# Patient Record
Sex: Male | Born: 1960
Health system: Southern US, Community
[De-identification: ages and names within clinical notes are randomized; demographics above are authoritative.]

## PROBLEM LIST (undated history)

## (undated) DIAGNOSIS — N189 Chronic kidney disease, unspecified: Secondary | ICD-10-CM

## (undated) DIAGNOSIS — Z87442 Personal history of urinary calculi: Secondary | ICD-10-CM

## (undated) HISTORY — PX: APPENDECTOMY: SHX54

## (undated) HISTORY — PX: VASECTOMY: SHX75

## (undated) HISTORY — PX: FRACTURE SURGERY: SHX138

## (undated) HISTORY — PX: TONSILLECTOMY: SUR1361

## (undated) HISTORY — PX: BACK SURGERY: SHX140

---

## 2005-07-30 ENCOUNTER — Ambulatory Visit: Payer: Self-pay | Admitting: Orthopaedic Surgery

## 2005-08-22 ENCOUNTER — Ambulatory Visit: Payer: Self-pay | Admitting: Orthopaedic Surgery

## 2005-11-18 ENCOUNTER — Observation Stay (HOSPITAL_COMMUNITY): Admission: RE | Admit: 2005-11-18 | Discharge: 2005-11-19 | Payer: Self-pay | Admitting: Neurosurgery

## 2007-02-20 ENCOUNTER — Ambulatory Visit: Payer: Self-pay | Admitting: Internal Medicine

## 2015-05-02 ENCOUNTER — Encounter: Payer: Self-pay | Admitting: *Deleted

## 2015-05-03 ENCOUNTER — Ambulatory Visit: Payer: 59 | Admitting: Certified Registered"

## 2015-05-03 ENCOUNTER — Encounter: Payer: Self-pay | Admitting: *Deleted

## 2015-05-03 ENCOUNTER — Encounter
Admission: RE | Disposition: A | Discharge status: Home / Self Care | Payer: Self-pay | Source: Ambulatory Visit | Attending: Unknown Physician Specialty

## 2015-05-03 ENCOUNTER — Ambulatory Visit
Admission: RE | Admit: 2015-05-03 | Discharge: 2015-05-03 | Disposition: A | Discharge status: Home / Self Care | Payer: 59 | Source: Ambulatory Visit | Attending: Unknown Physician Specialty | Admitting: Unknown Physician Specialty

## 2015-05-03 DIAGNOSIS — Z7982 Long term (current) use of aspirin: Secondary | ICD-10-CM | POA: Insufficient documentation

## 2015-05-03 DIAGNOSIS — K640 First degree hemorrhoids: Secondary | ICD-10-CM | POA: Insufficient documentation

## 2015-05-03 DIAGNOSIS — N189 Chronic kidney disease, unspecified: Secondary | ICD-10-CM | POA: Insufficient documentation

## 2015-05-03 DIAGNOSIS — Z1211 Encounter for screening for malignant neoplasm of colon: Secondary | ICD-10-CM | POA: Insufficient documentation

## 2015-05-03 DIAGNOSIS — D125 Benign neoplasm of sigmoid colon: Secondary | ICD-10-CM | POA: Diagnosis not present

## 2015-05-03 DIAGNOSIS — Z79899 Other long term (current) drug therapy: Secondary | ICD-10-CM | POA: Diagnosis not present

## 2015-05-03 HISTORY — PX: COLONOSCOPY WITH PROPOFOL: SHX5780

## 2015-05-03 HISTORY — DX: Chronic kidney disease, unspecified: N18.9

## 2015-05-03 SURGERY — COLONOSCOPY WITH PROPOFOL
Anesthesia: General

## 2015-05-03 MED ORDER — FENTANYL CITRATE (PF) 100 MCG/2ML IJ SOLN
INTRAMUSCULAR | Status: DC | PRN
  Administered 2015-05-03: 50 ug via INTRAVENOUS

## 2015-05-03 MED ORDER — PROPOFOL 10 MG/ML IV BOLUS
INTRAVENOUS | Status: DC | PRN
  Administered 2015-05-03: 50 mg via INTRAVENOUS
  Administered 2015-05-03: 20 mg via INTRAVENOUS

## 2015-05-03 MED ORDER — SODIUM CHLORIDE 0.9 % IV SOLN
INTRAVENOUS | Status: DC
  Administered 2015-05-03: 1000 mL via INTRAVENOUS

## 2015-05-03 MED ORDER — PROPOFOL 500 MG/50ML IV EMUL
INTRAVENOUS | Status: DC | PRN
  Administered 2015-05-03: 120 ug/kg/min via INTRAVENOUS

## 2015-05-03 MED ORDER — LIDOCAINE HCL (CARDIAC) 20 MG/ML IV SOLN
INTRAVENOUS | Status: DC | PRN
  Administered 2015-05-03: 50 mg via INTRAVENOUS

## 2015-05-03 MED ORDER — SODIUM CHLORIDE 0.9 % IV SOLN: INTRAVENOUS | Status: DC

## 2015-05-03 MED ORDER — MIDAZOLAM HCL 5 MG/5ML IJ SOLN
INTRAMUSCULAR | Status: DC | PRN
  Administered 2015-05-03: 1 mg via INTRAVENOUS

## 2015-05-03 NOTE — Anesthesia Preprocedure Evaluation (Signed)
Anesthesia Evaluation  Patient identified by MRN, date of birth, ID band Patient awake    Reviewed: Allergy & Precautions, H&P , NPO status , Patient's Chart, lab work & pertinent test results, reviewed documented beta blocker date and time   Airway Mallampati: III  TM Distance: >3 FB Neck ROM: full    Dental no notable dental hx.    Pulmonary neg pulmonary ROS,    Pulmonary exam normal breath sounds clear to auscultation       Cardiovascular Exercise Tolerance: Good negative cardio ROS   Rhythm:regular Rate:Normal     Neuro/Psych negative neurological ROS  negative psych ROS   GI/Hepatic negative GI ROS, Neg liver ROS,   Endo/Other  negative endocrine ROS  Renal/GU negative Renal ROS  negative genitourinary   Musculoskeletal   Abdominal   Peds  Hematology negative hematology ROS (+)   Anesthesia Other Findings   Reproductive/Obstetrics negative OB ROS                             Anesthesia Physical Anesthesia Plan  ASA: II  Anesthesia Plan: General   Post-op Pain Management:    Induction:   Airway Management Planned:   Additional Equipment:   Intra-op Plan:   Post-operative Plan:   Informed Consent: I have reviewed the patients History and Physical, chart, labs and discussed the procedure including the risks, benefits and alternatives for the proposed anesthesia with the patient or authorized representative who has indicated his/her understanding and acceptance.   Dental Advisory Given  Plan Discussed with: CRNA  Anesthesia Plan Comments:         Anesthesia Quick Evaluation

## 2015-05-03 NOTE — Anesthesia Postprocedure Evaluation (Signed)
Anesthesia Post Note  Patient: Philip Manning  Procedure(s) Performed: Procedure(s) (LRB): COLONOSCOPY WITH PROPOFOL (N/A)  Patient location during evaluation: PACU Anesthesia Type: General Level of consciousness: awake and alert Pain management: pain level controlled Vital Signs Assessment: post-procedure vital signs reviewed and stable Respiratory status: spontaneous breathing, nonlabored ventilation, respiratory function stable and patient connected to nasal cannula oxygen Cardiovascular status: blood pressure returned to baseline and stable Postop Assessment: no signs of nausea or vomiting Anesthetic complications: no    Last Vitals:  Filed Vitals:   05/03/15 1702 05/03/15 1712  BP: 127/101 136/98  Pulse: 77 75  Temp:    Resp: 15 11    Last Pain: There were no vitals filed for this visit.               Molli Barrows

## 2015-05-03 NOTE — Transfer of Care (Signed)
Immediate Anesthesia Transfer of Care Note  Patient: Philip Manning  Procedure(s) Performed: Procedure(s): COLONOSCOPY WITH PROPOFOL (N/A)  Patient Location: Endoscopy Unit  Anesthesia Type:General  Level of Consciousness: awake  Airway & Oxygen Therapy: Patient Spontanous Breathing and Patient connected to nasal cannula oxygen  Post-op Assessment: Report given to RN  Post vital signs: Reviewed  Last Vitals:  Filed Vitals:   05/03/15 1459 05/03/15 1641  BP: 142/94 115/97  Pulse: 102 82  Temp: 36.9 C 36.3 C  Resp:  12    Complications: No apparent anesthesia complications

## 2015-05-03 NOTE — Op Note (Signed)
[facility] Patient Name: Philip Manning Procedure Date: 05/03/2015 4:12 PM MRN: [id number] Account #: [id number] Date of Birth: 1961/03/09 Admit Type: Outpatient Age: 54 Room: [facility] ENDO ROOM 4 Gender: Male Note Status: Finalized Procedure:         Colonoscopy Indications:       Screening for colorectal malignant neoplasm Providers:         Manya Silvas, MD Referring MD:      Irven Easterly. Kary Kos, MD (Referring MD) Medicines:         Propofol per Anesthesia Complications:     No immediate complications. Procedure:         Pre-Anesthesia Assessment:                    - After reviewing the risks and benefits, the patient was                     deemed in satisfactory condition to undergo the procedure.                    After obtaining informed consent, the colonoscope was                     passed under direct vision. Throughout the procedure, the                     patient's blood pressure, pulse, and oxygen saturations                     were monitored continuously. The Colonoscope was                     introduced through the anus and advanced to the the cecum,                     identified by appendiceal orifice and ileocecal valve. The                     colonoscopy was performed without difficulty. The patient                     tolerated the procedure well. The quality of the bowel                     preparation was good. Findings:      A 5 mm polyp was found in the sigmoid colon. The polyp was sessile. The       polyp was removed with a cold snare. Resection and retrieval were       complete. To prevent bleeding after the polypectomy, two hemostatic       clips were successfully placed (MRI compatible). There was no bleeding       at the end of the procedure.      Internal hemorrhoids were found during endoscopy. The hemorrhoids were       small and Grade I (internal hemorrhoids that do not prolapse). Impression:        - One 5  mm polyp in the sigmoid colon. Resected and                     retrieved. MRI-compatible clips were placed.                    - Internal hemorrhoids. Recommendation:    -  Await pathology results. See aa urologist. Manya Silvas, MD 05/03/2015 4:45:52 PM This report has been signed electronically. Number of Addenda: 0 Note Initiated On: 05/03/2015 4:12 PM Scope Withdrawal Time: 0 hours 18 minutes 13 seconds  Total Procedure Duration: 0 hours 22 minutes 17 seconds       [facility]

## 2015-05-03 NOTE — H&P (Signed)
   Primary Care Physician:  Maryland Pink, MD Primary Gastroenterologist:  Dr. Vira Agar  Pre-Procedure History & Physical: HPI:  Philip Manning is a 54 y.o. male is here for an colonoscopy.   Past Medical History  Diagnosis Date  . Chronic kidney disease     Past Surgical History  Procedure Laterality Date  . Back surgery    . Appendectomy    . Fracture surgery    . Tonsillectomy    . Vasectomy      Prior to Admission medications   Medication Sig Start Date End Date Taking? Authorizing Provider  aspirin (ASPIRIN EC) 81 MG EC tablet Take 81 mg by mouth daily. Swallow whole.   Yes Historical Provider, MD  omega-3 acid ethyl esters (LOVAZA) 1 G capsule Take 1 g by mouth daily.   Yes Historical Provider, MD  vitamin C (ASCORBIC ACID) 500 MG tablet Take 500 mg by mouth daily.   Yes Historical Provider, MD    Allergies as of 04/14/2015  . (Not on File)    History reviewed. No pertinent family history.  Social History   Social History  . Marital Status: Married    Spouse Name: N/A  . Number of Children: N/A  . Years of Education: N/A   Occupational History  . Not on file.   Social History Main Topics  . Smoking status: Never Smoker   . Smokeless tobacco: Never Used  . Alcohol Use: No  . Drug Use: No  . Sexual Activity: Not on file   Other Topics Concern  . Not on file   Social History Narrative    Review of Systems: See HPI, otherwise negative ROS  Physical Exam: BP 127/101 mmHg  Pulse 77  Temp(Src) 97.3 F (36.3 C) (Tympanic)  Resp 15  Ht 5\' 7"  (1.702 m)  Wt 88.451 kg (195 lb)  BMI 30.53 kg/m2  SpO2 96% General:   Alert,  pleasant and cooperative in NAD Head:  Normocephalic and atraumatic. Neck:  Supple; no masses or thyromegaly. Lungs:  Clear throughout to auscultation.    Heart:  Regular rate and rhythm. Abdomen:  Soft, nontender and nondistended. Normal bowel sounds, without guarding, and without rebound.   Neurologic:  Alert and  oriented  x4;  grossly normal neurologically.  Impression/Plan: Philip Manning is here for an colonoscopy to be performed for screening colonoscopy  Risks, benefits, limitations, and alternatives regarding  colonoscopy have been reviewed with the patient.  Questions have been answered.  All parties agreeable.   Gaylyn Cheers, MD  05/03/2015, 5:12 PM

## 2015-05-04 ENCOUNTER — Encounter: Payer: Self-pay | Admitting: Unknown Physician Specialty

## 2015-05-05 LAB — SURGICAL PATHOLOGY

## 2015-08-08 DIAGNOSIS — C44320 Squamous cell carcinoma of skin of unspecified parts of face: Secondary | ICD-10-CM | POA: Insufficient documentation

## 2015-11-20 ENCOUNTER — Other Ambulatory Visit: Payer: Self-pay | Admitting: Family Medicine

## 2015-11-20 DIAGNOSIS — Z9889 Other specified postprocedural states: Secondary | ICD-10-CM

## 2015-11-20 DIAGNOSIS — M5432 Sciatica, left side: Secondary | ICD-10-CM

## 2015-11-21 ENCOUNTER — Other Ambulatory Visit: Payer: Self-pay | Admitting: Student

## 2015-11-21 ENCOUNTER — Other Ambulatory Visit: Payer: Self-pay | Admitting: Family Medicine

## 2015-11-21 DIAGNOSIS — M5442 Lumbago with sciatica, left side: Secondary | ICD-10-CM

## 2015-11-27 ENCOUNTER — Ambulatory Visit
Admission: RE | Admit: 2015-11-27 | Discharge: 2015-11-27 | Disposition: A | Discharge status: Home / Self Care | Payer: 59 | Source: Ambulatory Visit | Attending: Family Medicine | Admitting: Family Medicine

## 2015-11-27 DIAGNOSIS — M5442 Lumbago with sciatica, left side: Secondary | ICD-10-CM

## 2015-11-27 MED ORDER — GADOBENATE DIMEGLUMINE 529 MG/ML IV SOLN
19.0000 mL | Freq: Once | INTRAVENOUS | Status: AC | PRN
  Administered 2015-11-27: 19 mL via INTRAVENOUS

## 2015-12-05 ENCOUNTER — Ambulatory Visit: Payer: 59

## 2016-06-23 ENCOUNTER — Emergency Department
Admission: EM | Admit: 2016-06-23 | Discharge: 2016-06-23 | Disposition: A | Discharge status: Home / Self Care | Payer: 59 | Attending: Emergency Medicine | Admitting: Emergency Medicine

## 2016-06-23 ENCOUNTER — Encounter: Payer: Self-pay | Admitting: Emergency Medicine

## 2016-06-23 DIAGNOSIS — N189 Chronic kidney disease, unspecified: Secondary | ICD-10-CM | POA: Insufficient documentation

## 2016-06-23 DIAGNOSIS — J111 Influenza due to unidentified influenza virus with other respiratory manifestations: Secondary | ICD-10-CM | POA: Insufficient documentation

## 2016-06-23 DIAGNOSIS — R509 Fever, unspecified: Secondary | ICD-10-CM | POA: Diagnosis present

## 2016-06-23 MED ORDER — KETOROLAC TROMETHAMINE 30 MG/ML IJ SOLN
30.0000 mg | Freq: Once | INTRAMUSCULAR | Status: AC
  Administered 2016-06-23: 30 mg via INTRAVENOUS

## 2016-06-23 MED ORDER — OSELTAMIVIR PHOSPHATE 75 MG PO CAPS: 75.0000 mg | ORAL_CAPSULE | Freq: Two times a day (BID) | ORAL | 0 refills | Status: AC

## 2016-06-23 MED ORDER — ACETAMINOPHEN 500 MG PO TABS: 1000.0000 mg | ORAL_TABLET | Freq: Once | ORAL | Status: DC

## 2016-06-23 MED ORDER — BENZONATATE 100 MG PO CAPS: 100.0000 mg | ORAL_CAPSULE | Freq: Three times a day (TID) | ORAL | 0 refills | Status: AC | PRN

## 2016-06-23 MED ORDER — KETOROLAC TROMETHAMINE 30 MG/ML IJ SOLN
INTRAMUSCULAR | Status: AC
Start: 2016-06-23 — End: 2016-06-23
  Administered 2016-06-23: 30 mg via INTRAVENOUS
  Filled 2016-06-23: qty 1

## 2016-06-23 NOTE — ED Provider Notes (Signed)
[facility] Emergency Department Provider Note  ____________________________________________  Time seen: Approximately 7:47 PM  I have reviewed the triage vital signs and the nursing notes.   HISTORY  Chief Complaint Fever    HPI Philip Manning is a 56 y.o. male presenting to the emergency department with headache, congestion, myalgias and diarrhea for the past 3 days. Patient has been febrile. Fever has been as high as 102F assessed orally. Patient has been taking Tylenol. No recent travel. He is tolerating fluids by mouth. He has had diminished appetite and increased sleep. Patient denies chest pain, chest tightness, shortness of breath, abdominal pain, nausea and vomiting.    Past Medical History:  Diagnosis Date  . Chronic kidney disease     There are no active problems to display for this patient.   Past Surgical History:  Procedure Laterality Date  . APPENDECTOMY    . BACK SURGERY    . COLONOSCOPY WITH PROPOFOL N/A 05/03/2015   Procedure: COLONOSCOPY WITH PROPOFOL;  Surgeon: Manya Silvas, MD;  Location: [facility] ENDOSCOPY;  Service: Endoscopy;  Laterality: N/A;  . FRACTURE SURGERY    . TONSILLECTOMY    . VASECTOMY      Prior to Admission medications   Medication Sig Start Date End Date Taking? Authorizing Provider  aspirin (ASPIRIN EC) 81 MG EC tablet Take 81 mg by mouth daily. Swallow whole.    Historical Provider, MD  benzonatate (TESSALON PERLES) 100 MG capsule Take 1 capsule (100 mg total) by mouth 3 (three) times daily as needed for cough. 06/23/16 06/30/16  Lannie Fields, PA-C  omega-3 acid ethyl esters (LOVAZA) 1 G capsule Take 1 g by mouth daily.    Historical Provider, MD  oseltamivir (TAMIFLU) 75 MG capsule Take 1 capsule (75 mg total) by mouth 2 (two) times daily. 06/23/16 06/28/16  Lannie Fields, PA-C  vitamin C (ASCORBIC ACID) 500 MG tablet Take 500 mg by mouth daily.    Historical Provider, MD    Allergies Patient has no  known allergies.  No family history on file.  Social History Social History  Substance Use Topics  . Smoking status: Never Smoker  . Smokeless tobacco: Never Used  . Alcohol use No    Review of Systems  Constitutional: Patient has had fever.  Eyes: No visual changes. No discharge ENT: Patient has had congestion.  Cardiovascular: no chest pain. Respiratory: Patient has had non-productive cough.  No SOB. Gastrointestinal: Patient has had nausea.  Genitourinary: Negative for dysuria. No hematuria Musculoskeletal: Patient has had myalgias. Skin: Negative for rash, abrasions, lacerations, ecchymosis. Neurological: Patient has had headache, no focal weakness or numbness.   ____________________________________________   PHYSICAL EXAM:  VITAL SIGNS: ED Triage Vitals  Enc Vitals Group     BP 06/23/16 1841 (!) 157/104     Pulse Rate 06/23/16 1841 (!) 110     Resp 06/23/16 1841 18     Temp 06/23/16 1841 100.3 F (37.9 C)     Temp Source 06/23/16 1841 Oral     SpO2 06/23/16 1841 96 %     Weight 06/23/16 1841 195 lb (88.5 kg)     Height 06/23/16 1841 5\' 6"  (1.676 m)     Head Circumference --      Peak Flow --      Pain Score 06/23/16 1842 8     Pain Loc --      Pain Edu? --      Excl. in [place]? --  Constitutional: Alert and oriented. Patient is lying supine in bed.  Eyes: Conjunctivae are normal. PERRL. EOMI. Head: Atraumatic. ENT:      Ears: Tympanic membranes are injected bilaterally without evidence of effusion or purulent exudate. Bony landmarks are visualized bilaterally. No pain with palpation at the tragus.      Nose: Nasal turbinates are edematous and erythematous. Copious rhinorrhea visualized.      Mouth/Throat: Mucous membranes are moist. Posterior pharynx is mildly erythematous. No tonsillar hypertrophy or purulent exudate. Uvula is midline. Neck: Full range of motion. No pain is elicited with flexion at the neck. Hematological/Lymphatic/Immunilogical: No  cervical lymphadenopathy. Cardiovascular: Mildly tachycardic, regular rhythm. Normal S1 and S2. Good peripheral circulation. Respiratory: Normal respiratory effort without tachypnea or retractions. Lungs CTAB. Good air entry to the bases with no decreased or absent breath sounds. Gastrointestinal: Bowel sounds 4 quadrants. Soft and nontender to palpation. No guarding or rigidity. No palpable masses. No distention. No CVA tenderness.  Skin:  Skin is warm, dry and intact. No rash noted. Psychiatric: Mood and affect are normal. Speech and behavior are normal. Patient exhibits appropriate insight and judgement. ____________________________________________   LABS (all labs ordered are listed, but only abnormal results are displayed)  Labs Reviewed - No data to display ____________________________________________  EKG   ____________________________________________  RADIOLOGY   No results found.  ____________________________________________    PROCEDURES  Procedure(s) performed:    Procedures    Medications  ketorolac (TORADOL) 30 MG/ML injection 30 mg (not administered)  ketorolac (TORADOL) 30 MG/ML injection (not administered)     ____________________________________________   INITIAL IMPRESSION / ASSESSMENT AND PLAN / ED COURSE  Pertinent labs & imaging results that were available during my care of the patient were reviewed by me and considered in my medical decision making (see chart for details).  Review of the [place] CSRS was performed in accordance of the [place] prior to dispensing any controlled drugs.    Assessment and Plan:  Influenza Patient presents to the emergency department with headache, congestion, myalgias and diarrhea for the past 3 days. Symptoms are consistent with influenza. An injection of Toradol was given in the emergency department for headache. Patient was discharged with Tamiflu and Tessalon Perles. Patient was advised to follow-up with his  primary care provider in one week. Rest and Hydration were encouraged. A work note was provided. Patient feels comfortable being discharged from the emergency department. All patient questions were answered.   ____________________________________________  FINAL CLINICAL IMPRESSION(S) / ED DIAGNOSES  Final diagnoses:  Influenza      NEW MEDICATIONS STARTED DURING THIS VISIT:  New Prescriptions   BENZONATATE (TESSALON PERLES) 100 MG CAPSULE    Take 1 capsule (100 mg total) by mouth 3 (three) times daily as needed for cough.   OSELTAMIVIR (TAMIFLU) 75 MG CAPSULE    Take 1 capsule (75 mg total) by mouth 2 (two) times daily.        This chart was dictated using voice recognition software/Dragon. Despite best efforts to proofread, errors can occur which can change the meaning. Any change was purely unintentional.    Lannie Fields, PA-C 06/23/16 [place], MD 06/23/16 2010

## 2016-06-23 NOTE — ED Notes (Signed)
Pt. States HA on the lt. Side of head around ear that started Thursday. Pt. Denies dizziness.  Pt. States intermittent fever for the past 3 days.  Pt. States taking OTC medications with little relief.

## 2016-06-23 NOTE — ED Triage Notes (Signed)
Pt  Comes into the Ed via POv c/o fever since Friday.  Patient states he has been taking OTC tylenol but the fever comes back.  Denies any N/V/ or congestion.  States he has had a cough for a couple of days.  Patient in NAD at this time with even and unlabored respirations.

## 2016-06-23 NOTE — ED Notes (Signed)
NAD noted at time of D/C. Pt denies questions or concerns. Pt ambulatory to the lobby at this time.  

## 2016-08-27 DIAGNOSIS — M545 Low back pain: Secondary | ICD-10-CM | POA: Diagnosis not present

## 2016-08-27 DIAGNOSIS — R030 Elevated blood-pressure reading, without diagnosis of hypertension: Secondary | ICD-10-CM | POA: Diagnosis not present

## 2016-11-18 DIAGNOSIS — L570 Actinic keratosis: Secondary | ICD-10-CM | POA: Diagnosis not present

## 2016-11-18 DIAGNOSIS — C44319 Basal cell carcinoma of skin of other parts of face: Secondary | ICD-10-CM | POA: Diagnosis not present

## 2016-11-18 DIAGNOSIS — Z85828 Personal history of other malignant neoplasm of skin: Secondary | ICD-10-CM | POA: Diagnosis not present

## 2016-11-18 DIAGNOSIS — D485 Neoplasm of uncertain behavior of skin: Secondary | ICD-10-CM | POA: Diagnosis not present

## 2017-01-03 DIAGNOSIS — C44319 Basal cell carcinoma of skin of other parts of face: Secondary | ICD-10-CM | POA: Diagnosis not present

## 2017-04-28 DIAGNOSIS — L570 Actinic keratosis: Secondary | ICD-10-CM | POA: Diagnosis not present

## 2017-04-28 DIAGNOSIS — Z85828 Personal history of other malignant neoplasm of skin: Secondary | ICD-10-CM | POA: Diagnosis not present

## 2017-07-02 DIAGNOSIS — M25511 Pain in right shoulder: Secondary | ICD-10-CM | POA: Diagnosis not present

## 2017-07-17 DIAGNOSIS — N529 Male erectile dysfunction, unspecified: Secondary | ICD-10-CM | POA: Diagnosis not present

## 2017-07-17 DIAGNOSIS — E785 Hyperlipidemia, unspecified: Secondary | ICD-10-CM | POA: Diagnosis not present

## 2017-07-17 DIAGNOSIS — M25511 Pain in right shoulder: Secondary | ICD-10-CM | POA: Diagnosis not present

## 2017-07-17 DIAGNOSIS — Z0000 Encounter for general adult medical examination without abnormal findings: Secondary | ICD-10-CM | POA: Diagnosis not present

## 2017-07-22 DIAGNOSIS — M25511 Pain in right shoulder: Secondary | ICD-10-CM | POA: Diagnosis not present

## 2017-07-22 DIAGNOSIS — M5412 Radiculopathy, cervical region: Secondary | ICD-10-CM | POA: Diagnosis not present

## 2017-07-24 ENCOUNTER — Other Ambulatory Visit: Payer: Self-pay | Admitting: Sports Medicine

## 2017-07-24 DIAGNOSIS — G8929 Other chronic pain: Secondary | ICD-10-CM

## 2017-07-24 DIAGNOSIS — M25511 Pain in right shoulder: Secondary | ICD-10-CM | POA: Diagnosis not present

## 2017-07-31 DIAGNOSIS — M4802 Spinal stenosis, cervical region: Secondary | ICD-10-CM | POA: Diagnosis not present

## 2017-07-31 DIAGNOSIS — M5412 Radiculopathy, cervical region: Secondary | ICD-10-CM | POA: Diagnosis not present

## 2017-08-06 ENCOUNTER — Ambulatory Visit
Admission: RE | Admit: 2017-08-06 | Discharge: 2017-08-06 | Disposition: A | Discharge status: Home / Self Care | Payer: 59 | Source: Ambulatory Visit | Attending: Sports Medicine | Admitting: Sports Medicine

## 2017-08-06 DIAGNOSIS — M7521 Bicipital tendinitis, right shoulder: Secondary | ICD-10-CM | POA: Diagnosis not present

## 2017-08-06 DIAGNOSIS — M7551 Bursitis of right shoulder: Secondary | ICD-10-CM | POA: Diagnosis not present

## 2017-08-06 DIAGNOSIS — M19011 Primary osteoarthritis, right shoulder: Secondary | ICD-10-CM | POA: Diagnosis not present

## 2017-08-06 DIAGNOSIS — G8929 Other chronic pain: Secondary | ICD-10-CM | POA: Insufficient documentation

## 2017-08-06 DIAGNOSIS — M25511 Pain in right shoulder: Secondary | ICD-10-CM | POA: Diagnosis present

## 2017-08-06 DIAGNOSIS — M75101 Unspecified rotator cuff tear or rupture of right shoulder, not specified as traumatic: Secondary | ICD-10-CM | POA: Diagnosis not present

## 2017-08-08 DIAGNOSIS — M19011 Primary osteoarthritis, right shoulder: Secondary | ICD-10-CM | POA: Diagnosis not present

## 2017-08-08 DIAGNOSIS — M25511 Pain in right shoulder: Secondary | ICD-10-CM | POA: Diagnosis not present

## 2017-08-08 DIAGNOSIS — M7551 Bursitis of right shoulder: Secondary | ICD-10-CM | POA: Diagnosis not present

## 2017-08-08 DIAGNOSIS — S46211D Strain of muscle, fascia and tendon of other parts of biceps, right arm, subsequent encounter: Secondary | ICD-10-CM | POA: Diagnosis not present

## 2017-08-08 DIAGNOSIS — M7541 Impingement syndrome of right shoulder: Secondary | ICD-10-CM | POA: Diagnosis not present

## 2017-08-21 DIAGNOSIS — M4712 Other spondylosis with myelopathy, cervical region: Secondary | ICD-10-CM | POA: Diagnosis not present

## 2017-08-21 DIAGNOSIS — I10 Essential (primary) hypertension: Secondary | ICD-10-CM | POA: Diagnosis not present

## 2017-09-01 DIAGNOSIS — M4722 Other spondylosis with radiculopathy, cervical region: Secondary | ICD-10-CM | POA: Diagnosis not present

## 2017-09-01 DIAGNOSIS — M4712 Other spondylosis with myelopathy, cervical region: Secondary | ICD-10-CM | POA: Diagnosis not present

## 2017-09-01 DIAGNOSIS — M50121 Cervical disc disorder at C4-C5 level with radiculopathy: Secondary | ICD-10-CM | POA: Diagnosis not present

## 2017-09-09 DIAGNOSIS — M4712 Other spondylosis with myelopathy, cervical region: Secondary | ICD-10-CM | POA: Diagnosis not present

## 2017-09-18 DIAGNOSIS — M50223 Other cervical disc displacement at C6-C7 level: Secondary | ICD-10-CM | POA: Diagnosis not present

## 2017-09-18 DIAGNOSIS — M4712 Other spondylosis with myelopathy, cervical region: Secondary | ICD-10-CM | POA: Diagnosis not present

## 2017-10-14 ENCOUNTER — Other Ambulatory Visit: Payer: Self-pay

## 2017-10-14 ENCOUNTER — Ambulatory Visit: Payer: 59 | Attending: Neurosurgery | Admitting: Occupational Therapy

## 2017-10-14 ENCOUNTER — Ambulatory Visit: Payer: 59

## 2017-10-14 DIAGNOSIS — M5412 Radiculopathy, cervical region: Secondary | ICD-10-CM | POA: Insufficient documentation

## 2017-10-14 DIAGNOSIS — M6281 Muscle weakness (generalized): Secondary | ICD-10-CM | POA: Diagnosis not present

## 2017-10-14 NOTE — Therapy (Signed)
[place] MAIN [facility] SERVICES [address] [place], [place], [address] Phone: [phone]   Fax:  [phone]  Physical Therapy Evaluation  Patient Details  Name: Philip Manning MRN: [id number] Date of Birth: [birth year] No data recorded  Encounter Date: 10/14/2017  PT End of Session - 10/14/17 1201    Visit Number  1    Number of Visits  8    Date for PT Re-Evaluation  11/11/17    Authorization Type  1/10    PT Start Time  1032    PT Stop Time  1133    PT Time Calculation (min)  61 min    Activity Tolerance  Patient tolerated treatment well;Treatment limited secondary to medical complications (Comment)    Behavior During Therapy  [facility] for tasks assessed/performed       Past Medical History:  Diagnosis Date  . Chronic kidney disease     Past Surgical History:  Procedure Laterality Date  . APPENDECTOMY    . BACK SURGERY    . COLONOSCOPY WITH PROPOFOL N/A 05/03/2015   Procedure: COLONOSCOPY WITH PROPOFOL;  Surgeon: Manya Silvas, MD;  Location: [facility] ENDOSCOPY;  Service: Endoscopy;  Laterality: N/A;  . FRACTURE SURGERY    . TONSILLECTOMY    . VASECTOMY      There were no vitals filed for this visit.   Subjective Assessment - 10/14/17 1040    Subjective  Patient is a pleasant 57 year old male who presents with cervical radiculopathy bilaterally and C5 palsy of LUE s/p ACDF 09/01/17.    Pertinent History  Patient had surgery on L shoulder 15 years ago, R shoulder bicep tear occurred last year. MRI on R shoulder showed three muscles having tears and complete tear of small head of bicep. Left shoulder started hurting more than R shoulder. MRI of cervical neck showed two ruptured discs and pinching. Surgery was 09/01/17 anterior approach discectomy ACDF. After surgery c5 palsy of L arm. After a week later R arm no longer worked either.  Started being able to move R arm about a week ago overhead. Wants to return to playing with grandkids and  playing golf. 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     How long can you sit comfortably?  aches after a while (20 minutes)    How long can you stand comfortably?  n/a    How long can you walk comfortably?  n/a    Diagnostic tests  MRI, surgery    Patient Stated Goals  play with grandchildren, reduce pain, return to golf and work    Currently in Pain?  Yes    Pain Score  4     Pain Location  Shoulder    Pain Orientation  Right;Left    Pain Descriptors / Indicators  Aching    Pain Type  Chronic pain    Pain Onset  More than a month ago    Pain Frequency  Constant    Aggravating Factors   poor posture     Pain Relieving Factors  improving posture         [facility] PT Assessment - 10/14/17 0001      Assessment   Onset Date/Surgical Date  09/01/17    Hand Dominance  Right    Next MD Visit  june 14th    Prior Therapy  no      Precautions   Precautions  Cervical ACDF    Precaution Comments  avoid complete end range      Restrictions   Weight Bearing Restrictions  No      Balance Screen   Has the patient fallen in the past 6 months  Yes    How many times?  2    Has the patient had a decrease in activity level because of a fear of falling?   Yes    Is the patient reluctant to leave their home because of a fear of falling?   No      Home Environment   Living Environment  Private residence    Living Arrangements  Spouse/significant other    Available Help at Discharge  Family    Type of [place] to enter    Entrance Stairs-Number of Steps  [place]  One level      Prior Function   Level of Independence  Independent with basic ADLs    Vocation  Full time employment    Vocation Requirements  desk job     Leisure  golf, play with grandkids.       Cognition   Overall Cognitive Status  Within Functional Limits for tasks assessed      Observation/Other Assessments   Observations  FHRS, holding L arm in lap     Other Surveys    Other Surveys    Neck Disability Index   50%    Quick DASH   59.1%      Sensation   Light Touch  Appears Intact    Additional Comments  hypersensitivity  R C 6 hypersensitivity      Coordination   Gross Motor Movements are Fluid and Coordinated  No    Finger Nose Finger Test  unable to perform with L due to limited ROM       Posture/Postural Control   Posture/Postural Control  Postural limitations    Postural Limitations  Rounded Shoulders;Forward head      Flexibility   Soft Tissue Assessment /Muscle Length  yes         PAIN: Current: dull ache 4/10 L and R  Worst pain: 9/10 driving   PROM/AROM:     Right Left  Flexion 35  Extension 46  Side Bending 34 33  Rotation 43 41   standing Right Left  Shoulder Flexion WFL 35 with shrug   Shoulder Abduction WFL 32 with shrug  ER WFL At side functional  IR WFL At side functional    . Supine PROM Right Left  Shoulder Flexion WFL 160  Shoulder Abduction WFL 120  ER [facility] WFL  IR [facility] WFL    Bicep tendon muscle tissue release      Upper trap, levator scapulae, and scapular stabilizer musculature excessively tight with pain upon tissue manipulation.     STRENGTH:  Graded on a 0-5 scale Muscle Group Left Right  Shoulder flex 2/5 3/5  Shoulder Abd 2/5 3-/5  Shoulder Ext 2/5 3/5  Shoulder IR/ER    Elbow 4-/5 3/5  Wrist/hand 4/5 4/5   SENSATION: WFL  R C6 dermatome hypersensitive   SPECIAL TESTS: Unable to perform on L due to post surgical status and limited L mobility.   RUE and LUE: Median and Radial : muscular tightness no neurological +'s  -UMN tests  OUTCOME MEASURES: TEST Outcome Interpretation  QuickDash 59.1%   QuickDash Work 62.5%   [employer] (Golf)  100%   NDI 50% Severe  perceived disability                   Access Code: [email or web address]  URL: [email or web address]  Date: 10/14/2017  Prepared by: Janna Arch   Exercises  Supine Shoulder Flexion Extension AAROM  with Dowel - 10 reps - 1 sets - 5 hold - 1x daily - 7x weekly  Supine Shoulder Abduction AAROM with Dowel - 10 reps - 1 sets - 5 hold - 1x daily - 7x weekly  Isometric Shoulder Abduction at Wall - 10 reps - 1 sets - 5 hold - 1x daily - 7x weekly  Isometric Shoulder Extension at Wall - 10 reps - 1 sets - 5 hold - 1x daily - 7x weekly  Isometric Shoulder Flexion at Wall - 10 reps - 1 sets - 5 hold - 1x daily - 7x weekly  Isometric Shoulder External Rotation at Wall - 10 reps - 1 sets - 5 hold - 1x daily - 7x weekly  Standing Isometric Shoulder Internal Rotation at Doorway - 10 reps - 1 sets - 5 hold - 1x daily - 7x weekly     Objective measurements completed on examination: See above findings.              PT Education - 10/14/17 1200    Education Details  HEP, C5 palsy     Person(s) Educated  Patient    Methods  Explanation;Demonstration;Verbal cues;Handout    Comprehension  Verbalized understanding;Returned demonstration;Need further instruction       PT Short Term Goals - 10/14/17 1213      PT SHORT TERM GOAL #1   Title  Patient will be independent in home exercise program to improve strength/mobility for better functional independence with ADLs    Baseline  HEP given     Time  2    Period  Weeks    Status  New    Target Date  10/28/17      PT SHORT TERM GOAL #2   Title  Patient will report a worst pain of 6/10 on VAS in bilateal shoulders  to improve tolerance with ADLs and reduced symptoms with activities.     Baseline  6/4: 9/10     Time  2    Period  Weeks    Status  New    Target Date  10/28/17      PT SHORT TERM GOAL #3   Title  Patient will improve shoulder AROM to > 80 degrees of flexion, scaption, and abduction for improved ability to perform overhead activities.    Baseline  6/4: L abd: 32 flexion 35    Time  2    Period  Weeks    Status  New    Target Date  10/28/17        PT Long Term Goals - 10/14/17 1219      PT LONG TERM GOAL #1   Title   Patient will improve shoulder AROM to > 120 degrees of flexion, scaption, and abduction for improved ability to perform overhead activities.    Baseline  6/4: L flexion 35, abduction 32    Time  4    Period  Weeks    Status  New    Target Date  11/11/17      PT LONG TERM GOAL #2   Title  Patient will decrease Quick DASH score by > 8 points (51.1%)  demonstrating reduced self-reported upper extremity disability.    Baseline  6/4: 59.1%  Time  4    Period  Weeks    Status  New    Target Date  11/11/17      PT LONG TERM GOAL #3   Title  Patient will decrease the NDI score <35% to improve functional cervical mobility and quality of life.     Baseline  6/4: 50%    Time  4    Period  Weeks    Status  New    Target Date  11/11/17      PT LONG TERM GOAL #4   Title  Patient will report a worst pain of 3/10 on VAS in bilateral shoulders to improve tolerance with ADLs and reduced symptoms with activities.     Baseline  6/4: 9/10     Time  4    Period  Weeks    Status  New    Target Date  11/11/17      PT LONG TERM GOAL #5   Title  Patient will improve BUE strength to 4/5 to improve functional mobility, ROM, and return to iADLs, leisure activities, and work.     Baseline  6/4: L 2+/5     Time  4    Period  Weeks    Status  New    Target Date  11/11/17             Plan - 10/14/17 1208    Clinical Impression Statement  Patient is a pleasant 57 year old male who presents with cervical radiculopathy bilaterally and C5 palsy of LUE s/p ACDF 09/01/17. Patient has R C6 dermatome hypersensitivity to touch. Upper trap, levator scapulae, and other scapular musculature excessively tight and tender to palpation. Patient wings with attempted motion of LUE. Patient has near full ROM passively bilaterally however is unable to lift L shoulder actively against or with gravity. Patient's QuickDash 59.1% general, Work 62.5%, and sport (golf) 100% perceived disability. NDI=50%. Cervical ROM is  limited and increases symptoms towards end range. Patient would benefit from skilled physical therapy to improve muscle strength, decrease pain, and improve functional mobility for return to PLOF.     History and Personal Factors relevant to plan of care:  This patient presents with, 1- 2, personal factors/ comorbidities , and ,3 , body elements including body structures and functions, activity limitations and or participation restrictions. Patient's condition is evolving,    Clinical Presentation  Evolving    Clinical Presentation due to:  progressive changes of c5 palsy, pain, and mobility of shoulder     Clinical Decision Making  Moderate    Rehab Potential  Fair    Clinical Impairments Affecting Rehab Potential  (+) age, education level, (-) hx of shoulder problems    PT Frequency  2x / week    PT Duration  4 weeks    PT Treatment/Interventions  ADLs/Self Care Home Management;Aquatic Therapy;Biofeedback;Cryotherapy;Electrical Stimulation;Iontophoresis 4mg /ml Dexamethasone;Moist Heat;Traction;Ultrasound;Therapeutic activities;Therapeutic exercise;Neuromuscular re-education;Manual techniques;Patient/family education;Passive range of motion;Dry needling;Taping;Splinting;Energy conservation    PT Next Visit Plan  review HEP, STM upper trap, Ionto/estim?     PT Home Exercise Plan  see sheet    Consulted and Agree with Plan of Care  Patient       Patient will benefit from skilled therapeutic intervention in order to improve the following deficits and impairments:  Decreased activity tolerance, Decreased endurance, Decreased coordination, Decreased mobility, Decreased range of motion, Decreased strength, Hypomobility, Impaired flexibility, Impaired perceived functional ability, Increased muscle spasms, Increased fascial restricitons, Impaired sensation, Impaired UE  functional use, Postural dysfunction, Improper body mechanics, Pain  Visit Diagnosis: Radiculopathy, cervical region  Muscle strength  reduced     Problem List There are no active problems to display for this patient.  Janna Arch, PT, DPT   10/14/2017, 12:25 PM  [place] MAIN [facility] SERVICES [address] [place], [place], [address] Phone: [phone]   Fax:  [phone]  Name: Philip Manning MRN: [id number] Date of Birth: 1961/04/09

## 2017-10-14 NOTE — Patient Instructions (Signed)
Access Code: [email or web address]  URL: [email or web address]  Date: 10/14/2017  Prepared by: Janna Arch   Exercises  Supine Shoulder Flexion Extension AAROM with Dowel - 10 reps - 1 sets - 5 hold - 1x daily - 7x weekly  Supine Shoulder Abduction AAROM with Dowel - 10 reps - 1 sets - 5 hold - 1x daily - 7x weekly  Isometric Shoulder Abduction at Wall - 10 reps - 1 sets - 5 hold - 1x daily - 7x weekly  Isometric Shoulder Extension at Wall - 10 reps - 1 sets - 5 hold - 1x daily - 7x weekly  Isometric Shoulder Flexion at Wall - 10 reps - 1 sets - 5 hold - 1x daily - 7x weekly  Isometric Shoulder External Rotation at Wall - 10 reps - 1 sets - 5 hold - 1x daily - 7x weekly  Standing Isometric Shoulder Internal Rotation at Doorway - 10 reps - 1 sets - 5 hold - 1x daily - 7x weekly

## 2017-10-18 NOTE — Therapy (Signed)
[place] MAIN [facility] SERVICES [address] [place], [place], [address] Phone: [phone]   Fax:  [phone]  Patient Details  Name: Philip Manning MRN: [id number] Date of Birth: Jun 26, 1960 Referring Provider:  Newman Pies, MD  Encounter Date: 10/14/2017  Patient screened this date, he reports he is now independent with self care tasks and is doing well, does not have any OT needs at this time.  Will be seen by PT for evaluation and treatment for cervical issues.  Achilles Dunk, OTR/L, CLT  Christine Morton 10/18/2017, 9:24 AM  [place] MAIN [facility] SERVICES [address] [place], [place], [address] Phone: [phone]   Fax:  [phone]

## 2017-10-20 ENCOUNTER — Ambulatory Visit: Payer: 59

## 2017-10-20 ENCOUNTER — Encounter: Payer: 59 | Admitting: Occupational Therapy

## 2017-10-20 DIAGNOSIS — M6281 Muscle weakness (generalized): Secondary | ICD-10-CM

## 2017-10-20 DIAGNOSIS — M5412 Radiculopathy, cervical region: Secondary | ICD-10-CM

## 2017-10-20 NOTE — Therapy (Signed)
[place] MAIN [facility] SERVICES [address] [place], [place], [address] Phone: [phone]   Fax:  [phone]  Physical Therapy Treatment  Patient Details  Name: Philip Manning MRN: [id number] Date of Birth: 1960/06/10 No data recorded  Encounter Date: 10/20/2017  PT End of Session - 10/20/17 8938    Visit Number  2    Number of Visits  8    Date for PT Re-Evaluation  11/11/17    Authorization Type  2/10    PT Start Time  0930    PT Stop Time  1014    PT Time Calculation (min)  44 min    Activity Tolerance  Patient tolerated treatment well;Treatment limited secondary to medical complications (Comment)    Behavior During Therapy  [facility] for tasks assessed/performed       Past Medical History:  Diagnosis Date  . Chronic kidney disease     Past Surgical History:  Procedure Laterality Date  . APPENDECTOMY    . BACK SURGERY    . COLONOSCOPY WITH PROPOFOL N/A 05/03/2015   Procedure: COLONOSCOPY WITH PROPOFOL;  Surgeon: Manya Silvas, MD;  Location: [facility] ENDOSCOPY;  Service: Endoscopy;  Laterality: N/A;  . FRACTURE SURGERY    . TONSILLECTOMY    . VASECTOMY      There were no vitals filed for this visit.  Subjective Assessment - 10/20/17 0933    Subjective  Patient reports compliance with HEP everyday. Reports no concerns/ complaints.     Pertinent History  Patient had surgery on L shoulder 15 years ago, R shoulder bicep tear occurred last year. MRI on R shoulder showed three muscles having tears and complete tear of small head of bicep. Left shoulder started hurting more than R shoulder. MRI of cervical neck showed two ruptured discs and pinching. Surgery was 09/01/17 anterior approach discectomy ACDF. After surgery c5 palsy of L arm. After a week later R arm no longer worked either.  Started being able to move R arm about a week ago overhead. Wants to return to playing with grandkids and playing golf. 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     How long can you sit comfortably?  aches after a while (20 minutes)    How long can you stand comfortably?  n/a    How long can you walk comfortably?  n/a    Diagnostic tests  MRI, surgery    Patient Stated Goals  play with grandchildren, reduce pain, return to golf and work    Currently in Pain?  Yes    Pain Score  3     Pain Location  Shoulder    Pain Orientation  Right;Left    Pain Descriptors / Indicators  Aching    Pain Type  Chronic pain    Pain Onset  More than a month ago    Pain Frequency  Constant       Review HEP  Supine Shoulder Flexion Extension AAROM with Dowel - 10 reps - 1 sets - 5 hold - 1x daily - 7x weekly   Supine Shoulder Abduction AAROM with Dowel - 10 reps - 1 sets - 5 hold - 1x daily - 7x weekly   Isometric Shoulder Abduction at Wall - 10 reps - 1 sets - 5 hold - 1x daily - 7x weekly   Isometric Shoulder Extension at Wall - 10 reps - 1 sets - 5 hold - 1x daily - 7x weekly  Isometric Shoulder Flexion at Wall - 10 reps - 1 sets - 5 hold - 1x daily - 7x weekly   Isometric Shoulder External Rotation at Wall - 10 reps - 1 sets - 5 hold - 1x daily - 7x weekly   Standing Isometric Shoulder Internal Rotation at Doorway - 10 reps - 1 sets - 5 hold - 1x daily - 7x weekly     Supine:   STM L bicep trigger point with mobilization 2 minutes  L and R UE Nerve glides Median 15x, radial 15x  SB stretch with overpressure at shoulder 3x20 seconds  Rotation stretch with overpressure at shoulder 3x20 seconds  First rib mobilizations 2x15 seconds  Suboccipital release 4x20 seconds    Seated: STM with trigger point with movement 3 minutes cervical paraspinals and upper trap. Focus on scapular medial borders   Pt. response to medical necessity:  Patient would benefit from skilled physical therapy to improve muscle strength, decrease pain, and improve functional mobility for return to PLOF.                     PT Education -  10/20/17 0936    Education Details  HEP, exercise technique     Person(s) Educated  Patient    Methods  Explanation;Demonstration;Verbal cues    Comprehension  Verbalized understanding;Returned demonstration       PT Short Term Goals - 10/14/17 1213      PT SHORT TERM GOAL #1   Title  Patient will be independent in home exercise program to improve strength/mobility for better functional independence with ADLs    Baseline  HEP given     Time  2    Period  Weeks    Status  New    Target Date  10/28/17      PT SHORT TERM GOAL #2   Title  Patient will report a worst pain of 6/10 on VAS in bilateal shoulders  to improve tolerance with ADLs and reduced symptoms with activities.     Baseline  6/4: 9/10     Time  2    Period  Weeks    Status  New    Target Date  10/28/17      PT SHORT TERM GOAL #3   Title  Patient will improve shoulder AROM to > 80 degrees of flexion, scaption, and abduction for improved ability to perform overhead activities.    Baseline  6/4: L abd: 32 flexion 35    Time  2    Period  Weeks    Status  New    Target Date  10/28/17        PT Long Term Goals - 10/14/17 1219      PT LONG TERM GOAL #1   Title  Patient will improve shoulder AROM to > 120 degrees of flexion, scaption, and abduction for improved ability to perform overhead activities.    Baseline  6/4: L flexion 35, abduction 32    Time  4    Period  Weeks    Status  New    Target Date  11/11/17      PT LONG TERM GOAL #2   Title  Patient will decrease Quick DASH score by > 8 points (51.1%)  demonstrating reduced self-reported upper extremity disability.    Baseline  6/4: 59.1%    Time  4    Period  Weeks    Status  New    Target Date  11/11/17      PT LONG TERM GOAL #3   Title  Patient will decrease the NDI score <35% to improve functional cervical mobility and quality of life.     Baseline  6/4: 50%    Time  4    Period  Weeks    Status  New    Target Date  11/11/17      PT LONG TERM  GOAL #4   Title  Patient will report a worst pain of 3/10 on VAS in bilateral shoulders to improve tolerance with ADLs and reduced symptoms with activities.     Baseline  6/4: 9/10     Time  4    Period  Weeks    Status  New    Target Date  11/11/17      PT LONG TERM GOAL #5   Title  Patient will improve BUE strength to 4/5 to improve functional mobility, ROM, and return to iADLs, leisure activities, and work.     Baseline  6/4: L 2+/5     Time  4    Period  Weeks    Status  New    Target Date  11/11/17            Plan - 10/20/17 1056    Clinical Impression Statement  Patient presents with tight cervical paraspinals and upper trap musculature resulting in nerve entrapment and pain. Patient had tight musculature surrounding medial border of scapula and upon release demonstrated decreased VAS. Patient demonstrates full PROM in supine position however unable to perform against gravity.  Patient would benefit from skilled physical therapy to improve muscle strength, decrease pain, and improve functional mobility for return to PLOF.     Rehab Potential  Fair    Clinical Impairments Affecting Rehab Potential  (+) age, education level, (-) hx of shoulder problems    PT Frequency  2x / week    PT Duration  4 weeks    PT Treatment/Interventions  ADLs/Self Care Home Management;Aquatic Therapy;Biofeedback;Cryotherapy;Electrical Stimulation;Iontophoresis 4mg /ml Dexamethasone;Moist Heat;Traction;Ultrasound;Therapeutic activities;Therapeutic exercise;Neuromuscular re-education;Manual techniques;Patient/family education;Passive range of motion;Dry needling;Taping;Splinting;Energy conservation    PT Next Visit Plan  review HEP, STM upper trap, Ionto/estim?     PT Home Exercise Plan  see sheet    Consulted and Agree with Plan of Care  Patient       Patient will benefit from skilled therapeutic intervention in order to improve the following deficits and impairments:  Decreased activity tolerance,  Decreased endurance, Decreased coordination, Decreased mobility, Decreased range of motion, Decreased strength, Hypomobility, Impaired flexibility, Impaired perceived functional ability, Increased muscle spasms, Increased fascial restricitons, Impaired sensation, Impaired UE functional use, Postural dysfunction, Improper body mechanics, Pain  Visit Diagnosis: Radiculopathy, cervical region  Muscle strength reduced  Muscle weakness (generalized)     Problem List There are no active problems to display for this patient.  Janna Arch, PT, DPT   10/20/2017, 10:56 AM  [place] MAIN [facility] SERVICES [address] [place], [place], [address] Phone: [phone]   Fax:  [phone]  Name: Philip Manning MRN: [id number] Date of Birth: 11/28/1960

## 2017-10-23 ENCOUNTER — Encounter: Payer: 59 | Admitting: Occupational Therapy

## 2017-10-23 ENCOUNTER — Ambulatory Visit: Payer: 59

## 2017-10-23 DIAGNOSIS — M6281 Muscle weakness (generalized): Secondary | ICD-10-CM | POA: Diagnosis not present

## 2017-10-23 DIAGNOSIS — M5412 Radiculopathy, cervical region: Secondary | ICD-10-CM

## 2017-10-23 NOTE — Therapy (Signed)
[place] MAIN [facility] SERVICES [address] [place], [place], [address] Phone: [phone]   Fax:  [phone]  Physical Therapy Treatment  Patient Details  Name: Philip Manning MRN: [id number] Date of Birth: 01-15-1961 No data recorded  Encounter Date: 10/23/2017  PT End of Session - 10/23/17 1154    Visit Number  3    Number of Visits  8    Date for PT Re-Evaluation  11/11/17    Authorization Type  3/10    PT Start Time  1101    PT Stop Time  1148    PT Time Calculation (min)  47 min    Activity Tolerance  Patient tolerated treatment well;Treatment limited secondary to medical complications (Comment)    Behavior During Therapy  [facility] for tasks assessed/performed       Past Medical History:  Diagnosis Date  . Chronic kidney disease     Past Surgical History:  Procedure Laterality Date  . APPENDECTOMY    . BACK SURGERY    . COLONOSCOPY WITH PROPOFOL N/A 05/03/2015   Procedure: COLONOSCOPY WITH PROPOFOL;  Surgeon: Manya Silvas, MD;  Location: [facility] ENDOSCOPY;  Service: Endoscopy;  Laterality: N/A;  . FRACTURE SURGERY    . TONSILLECTOMY    . VASECTOMY      There were no vitals filed for this visit.  Subjective Assessment - 10/23/17 1105    Subjective  Patient reports soreness on L arm in posterior aspect of arm. Reports compliance with HEP.     Pertinent History  Patient had surgery on L shoulder 15 years ago, R shoulder bicep tear occurred last year. MRI on R shoulder showed three muscles having tears and complete tear of small head of bicep. Left shoulder started hurting more than R shoulder. MRI of cervical neck showed two ruptured discs and pinching. Surgery was 09/01/17 anterior approach discectomy ACDF. After surgery c5 palsy of L arm. After a week later R arm no longer worked either.  Started being able to move R arm about a week ago overhead. Wants to return to playing with grandkids and playing golf. 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     How long can you sit comfortably?  aches after a while (20 minutes)    How long can you stand comfortably?  n/a    How long can you walk comfortably?  n/a    Diagnostic tests  MRI, surgery    Patient Stated Goals  play with grandchildren, reduce pain, return to golf and work    Currently in Pain?  Yes    Pain Score  2     Pain Location  Shoulder    Pain Orientation  Right;Left    Pain Descriptors / Indicators  Aching    Pain Type  Chronic pain    Pain Onset  More than a month ago    Pain Frequency  Constant       STM L bicep trigger point with mobilization 2 minutes             L and R UE Nerve glides Median 15x, radial 15x             SB stretch with overpressure at shoulder 3x20 seconds             Rotation stretch with overpressure at shoulder 3x20 seconds             First rib mobilizations 2x15  seconds             Suboccipital release 4x20 seconds   STM 4 minutes cervical paraspinals and upper trap. Noted trigger points and limited tissue mobility of L cervical and superomedial border of scapula.   Supine: TherEx Chin Tucks PNF pattern D1 and D2 10x each AROM, 10x each 2lb weight             Snow angel abduction 10x   Overhead AROM flexion 10x    Seated Seated arm ER IR (hands on knees, hands behind back) 2x15      Pt. response to medical necessity:   Patient would benefit from skilled physical therapy to improve muscle strength, decrease pain, and improve functional mobility for return to PLOF.       Access Code: [email or web address]  URL: [email or web address]  Date: 10/23/2017  Prepared by: Janna Arch   Exercises  Supine PNF D2 Flexion with Resistance - 10 reps - 1 sets - 5 hold - 1x daily - 7x weekly  Standing Single Arm Shoulder PNF D1 Extension - 10 reps - 1 sets - 5 hold - 1x daily - 7x weekly                        PT Education - 10/23/17 1153    Education Details  exercise technique, PNF,  AROM,education on c5 palsy and strengthening    Person(s) Educated  Patient    Methods  Explanation;Demonstration;Verbal cues;Tactile cues;Handout    Comprehension  Verbalized understanding;Returned demonstration;Need further instruction       PT Short Term Goals - 10/14/17 1213      PT SHORT TERM GOAL #1   Title  Patient will be independent in home exercise program to improve strength/mobility for better functional independence with ADLs    Baseline  HEP given     Time  2    Period  Weeks    Status  New    Target Date  10/28/17      PT SHORT TERM GOAL #2   Title  Patient will report a worst pain of 6/10 on VAS in bilateal shoulders  to improve tolerance with ADLs and reduced symptoms with activities.     Baseline  6/4: 9/10     Time  2    Period  Weeks    Status  New    Target Date  10/28/17      PT SHORT TERM GOAL #3   Title  Patient will improve shoulder AROM to > 80 degrees of flexion, scaption, and abduction for improved ability to perform overhead activities.    Baseline  6/4: L abd: 32 flexion 35    Time  2    Period  Weeks    Status  New    Target Date  10/28/17        PT Long Term Goals - 10/14/17 1219      PT LONG TERM GOAL #1   Title  Patient will improve shoulder AROM to > 120 degrees of flexion, scaption, and abduction for improved ability to perform overhead activities.    Baseline  6/4: L flexion 35, abduction 32    Time  4    Period  Weeks    Status  New    Target Date  11/11/17      PT LONG TERM GOAL #2   Title  Patient will decrease Quick DASH score by > 8 points (51.1%)  demonstrating reduced  self-reported upper extremity disability.    Baseline  6/4: 59.1%    Time  4    Period  Weeks    Status  New    Target Date  11/11/17      PT LONG TERM GOAL #3   Title  Patient will decrease the NDI score <35% to improve functional cervical mobility and quality of life.     Baseline  6/4: 50%    Time  4    Period  Weeks    Status  New    Target Date   11/11/17      PT LONG TERM GOAL #4   Title  Patient will report a worst pain of 3/10 on VAS in bilateral shoulders to improve tolerance with ADLs and reduced symptoms with activities.     Baseline  6/4: 9/10     Time  4    Period  Weeks    Status  New    Target Date  11/11/17      PT LONG TERM GOAL #5   Title  Patient will improve BUE strength to 4/5 to improve functional mobility, ROM, and return to iADLs, leisure activities, and work.     Baseline  6/4: L 2+/5     Time  4    Period  Weeks    Status  New    Target Date  11/11/17            Plan - 10/23/17 1155    Clinical Impression Statement   Patient demonstrates improved ROM without gravity (supine position) resulting in progression to AROM and PNF with 2lb weight. Education of C5 palsy and strengthening performed. Noted trigger points and limited tissue mobility of L cervical and superomedial border of scapula. Patient would benefit from skilled physical therapy to improve muscle strength, decrease pain, and improve functional mobility for return to PLOF.     Rehab Potential  Fair    Clinical Impairments Affecting Rehab Potential  (+) age, education level, (-) hx of shoulder problems    PT Frequency  2x / week    PT Duration  4 weeks    PT Treatment/Interventions  ADLs/Self Care Home Management;Aquatic Therapy;Biofeedback;Cryotherapy;Electrical Stimulation;Iontophoresis 4mg /ml Dexamethasone;Moist Heat;Traction;Ultrasound;Therapeutic activities;Therapeutic exercise;Neuromuscular re-education;Manual techniques;Patient/family education;Passive range of motion;Dry needling;Taping;Splinting;Energy conservation    PT Next Visit Plan  review HEP, STM upper trap, Ionto/estim?     PT Home Exercise Plan  see sheet    Consulted and Agree with Plan of Care  Patient       Patient will benefit from skilled therapeutic intervention in order to improve the following deficits and impairments:  Decreased activity tolerance, Decreased  endurance, Decreased coordination, Decreased mobility, Decreased range of motion, Decreased strength, Hypomobility, Impaired flexibility, Impaired perceived functional ability, Increased muscle spasms, Increased fascial restricitons, Impaired sensation, Impaired UE functional use, Postural dysfunction, Improper body mechanics, Pain  Visit Diagnosis: Radiculopathy, cervical region  Muscle strength reduced  Muscle weakness (generalized)     Problem List There are no active problems to display for this patient. Janna Arch, PT, DPT   10/23/2017, 12:00 PM  [place] MAIN [facility] SERVICES [address] [place], [place], [address] Phone: [phone]   Fax:  [phone]  Name: YOREL REDDER MRN: [id number] Date of Birth: Jul 14, 1960

## 2017-10-24 DIAGNOSIS — R030 Elevated blood-pressure reading, without diagnosis of hypertension: Secondary | ICD-10-CM | POA: Diagnosis not present

## 2017-10-24 DIAGNOSIS — M5412 Radiculopathy, cervical region: Secondary | ICD-10-CM | POA: Diagnosis not present

## 2017-10-27 ENCOUNTER — Encounter: Payer: 59 | Admitting: Occupational Therapy

## 2017-10-27 ENCOUNTER — Encounter: Payer: Self-pay | Admitting: Physical Therapy

## 2017-10-27 ENCOUNTER — Ambulatory Visit: Payer: 59 | Admitting: Physical Therapy

## 2017-10-27 DIAGNOSIS — M6281 Muscle weakness (generalized): Secondary | ICD-10-CM | POA: Diagnosis not present

## 2017-10-27 DIAGNOSIS — M5412 Radiculopathy, cervical region: Secondary | ICD-10-CM

## 2017-10-27 NOTE — Therapy (Signed)
[place] MAIN [facility] SERVICES [address] [place], [place], [address] Phone: [phone]   Fax:  [phone]  Physical Therapy Treatment  Patient Details  Name: Philip Manning MRN: [id number] Date of Birth: [birth year] No data recorded  Encounter Date: 10/27/2017  PT End of Session - 10/27/17 0809    Visit Number  4    Number of Visits  8    Date for PT Re-Evaluation  11/11/17    Authorization Type  4/10    PT Start Time  0804    PT Stop Time  0844    PT Time Calculation (min)  40 min    Activity Tolerance  Patient tolerated treatment well;Treatment limited secondary to medical complications (Comment)    Behavior During Therapy  [facility] for tasks assessed/performed       Past Medical History:  Diagnosis Date  . Chronic kidney disease     Past Surgical History:  Procedure Laterality Date  . APPENDECTOMY    . BACK SURGERY    . COLONOSCOPY WITH PROPOFOL N/A 05/03/2015   Procedure: COLONOSCOPY WITH PROPOFOL;  Surgeon: Manya Silvas, MD;  Location: [facility] ENDOSCOPY;  Service: Endoscopy;  Laterality: N/A;  . FRACTURE SURGERY    . TONSILLECTOMY    . VASECTOMY      There were no vitals filed for this visit.  Subjective Assessment - 10/27/17 0809    Subjective  Pt reports that he is doing well but that his shoulder joints still feels sore.  Pt has been completing his HEP every day without questions or concerns.      Pertinent History  Patient had surgery on L shoulder 15 years ago, R shoulder bicep tear occurred last year. MRI on R shoulder showed three muscles having tears and complete tear of small head of bicep. Left shoulder started hurting more than R shoulder. MRI of cervical neck showed two ruptured discs and pinching. Surgery was 09/01/17 anterior approach discectomy ACDF. After surgery c5 palsy of L arm. After a week later R arm no longer worked either.  Started being able to move R arm about a week ago overhead. Wants to return to  playing with grandkids and playing golf. 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     How long can you sit comfortably?  aches after a while (20 minutes)    How long can you stand comfortably?  n/a    How long can you walk comfortably?  n/a    Diagnostic tests  MRI, surgery    Patient Stated Goals  play with grandchildren, reduce pain, return to golf and work    Currently in Pain?  Yes    Pain Score  3     Pain Location  Shoulder    Pain Orientation  Left;Right    Pain Descriptors / Indicators  Sore    Pain Type  Chronic pain    Pain Onset  More than a month ago       TREATMENT  Supine L and R UE Nerve glides Median 15x with max verbal cues for technique and to keep elbow in extension  In supine SB stretch with overpressure at shoulder 3x20 seconds each direction  In supine cervical rotation stretch with overpressure at shoulder 3x20 seconds each direction  Suboccipital release 4x20 seconds  Supine chin tucks x20  Supine PNF pattern D1 and D2 10x each AROM, 10x each 2lb weight?  Supine Snow angel abduction 10x  Supine overhead AROM flexion 10x  Grade III first rib mobilizations in sitting 2x15 seconds each side with significant tightness noted on L side.  Seated Bil shoulder circles x15 forward and back  In standing radial nerve glide 15x                         PT Education - 10/27/17 0809    Education Details  Exercise technique    Person(s) Educated  Patient    Methods  Explanation;Verbal cues    Comprehension  Verbalized understanding;Verbal cues required;Returned demonstration;Need further instruction       PT Short Term Goals - 10/14/17 1213      PT SHORT TERM GOAL #1   Title  Patient will be independent in home exercise program to improve strength/mobility for better functional independence with ADLs    Baseline  HEP given     Time  2    Period  Weeks    Status  New    Target Date  10/28/17      PT SHORT TERM GOAL #2   Title   Patient will report a worst pain of 6/10 on VAS in bilateal shoulders  to improve tolerance with ADLs and reduced symptoms with activities.     Baseline  6/4: 9/10     Time  2    Period  Weeks    Status  New    Target Date  10/28/17      PT SHORT TERM GOAL #3   Title  Patient will improve shoulder AROM to > 80 degrees of flexion, scaption, and abduction for improved ability to perform overhead activities.    Baseline  6/4: L abd: 32 flexion 35    Time  2    Period  Weeks    Status  New    Target Date  10/28/17        PT Long Term Goals - 10/14/17 1219      PT LONG TERM GOAL #1   Title  Patient will improve shoulder AROM to > 120 degrees of flexion, scaption, and abduction for improved ability to perform overhead activities.    Baseline  6/4: L flexion 35, abduction 32    Time  4    Period  Weeks    Status  New    Target Date  11/11/17      PT LONG TERM GOAL #2   Title  Patient will decrease Quick DASH score by > 8 points (51.1%)  demonstrating reduced self-reported upper extremity disability.    Baseline  6/4: 59.1%    Time  4    Period  Weeks    Status  New    Target Date  11/11/17      PT LONG TERM GOAL #3   Title  Patient will decrease the NDI score <35% to improve functional cervical mobility and quality of life.     Baseline  6/4: 50%    Time  4    Period  Weeks    Status  New    Target Date  11/11/17      PT LONG TERM GOAL #4   Title  Patient will report a worst pain of 3/10 on VAS in bilateral shoulders to improve tolerance with ADLs and reduced symptoms with activities.     Baseline  6/4: 9/10     Time  4    Period  Weeks    Status  New  Target Date  11/11/17      PT LONG TERM GOAL #5   Title  Patient will improve BUE strength to 4/5 to improve functional mobility, ROM, and return to iADLs, leisure activities, and work.     Baseline  6/4: L 2+/5     Time  4    Period  Weeks    Status  New    Target Date  11/11/17            Plan - 10/27/17  0811    Clinical Impression Statement  Pt required max verbal cues and demonstration for proper median nerve glide BUEs in supine and radial nerve gliding in standing.  Pt continues to demonstrate tight musculature in suboccipital muscles so continued STM and ischemic compression efforts to this region.  Instructed pt in simple shoulder movement exercises to perform when he wakes in the morning to decrease stiffness (including shoulder rows, Bil shoulder F, and Bil shoulder Abd with elbow bent at 90 deg.  Pt will  benefit from continued skilled PT interventions for decreased pain and improved ROM and functional use of BUEs.     Rehab Potential  Fair    Clinical Impairments Affecting Rehab Potential  (+) age, education level, (-) hx of shoulder problems    PT Frequency  2x / week    PT Duration  4 weeks    PT Treatment/Interventions  ADLs/Self Care Home Management;Aquatic Therapy;Biofeedback;Cryotherapy;Electrical Stimulation;Iontophoresis 4mg /ml Dexamethasone;Moist Heat;Traction;Ultrasound;Therapeutic activities;Therapeutic exercise;Neuromuscular re-education;Manual techniques;Patient/family education;Passive range of motion;Dry needling;Taping;Splinting;Energy conservation    PT Next Visit Plan  review HEP, STM upper trap, Ionto/estim?     PT Home Exercise Plan  see sheet    Consulted and Agree with Plan of Care  Patient       Patient will benefit from skilled therapeutic intervention in order to improve the following deficits and impairments:  Decreased activity tolerance, Decreased endurance, Decreased coordination, Decreased mobility, Decreased range of motion, Decreased strength, Hypomobility, Impaired flexibility, Impaired perceived functional ability, Increased muscle spasms, Increased fascial restricitons, Impaired sensation, Impaired UE functional use, Postural dysfunction, Improper body mechanics, Pain  Visit Diagnosis: Radiculopathy, cervical region  Muscle strength reduced  Muscle  weakness (generalized)     Problem List There are no active problems to display for this patient.   Collie Siad PT, DPT 10/27/2017, 8:44 AM  [place] MAIN [facility] SERVICES [address] [place], [place], [address] Phone: [phone]   Fax:  [phone]  Name: Philip Manning MRN: [id number] Date of Birth: Oct 11, 1960

## 2017-10-29 ENCOUNTER — Encounter: Payer: 59 | Admitting: Occupational Therapy

## 2017-10-29 ENCOUNTER — Ambulatory Visit: Payer: 59

## 2017-10-29 DIAGNOSIS — M6281 Muscle weakness (generalized): Secondary | ICD-10-CM | POA: Diagnosis not present

## 2017-10-29 DIAGNOSIS — M5412 Radiculopathy, cervical region: Secondary | ICD-10-CM

## 2017-10-29 NOTE — Therapy (Signed)
[place] MAIN [facility] SERVICES [address] [place], [place], [address] Phone: [phone]   Fax:  [phone]  Physical Therapy Treatment  Patient Details  Name: Philip Manning MRN: [id number] Date of Birth: 1961-03-20 No data recorded  Encounter Date: 10/29/2017  PT End of Session - 10/29/17 0820    Visit Number  5    Number of Visits  8    Date for PT Re-Evaluation  11/11/17    Authorization Type  5/10    PT Start Time  0800    PT Stop Time  0844    PT Time Calculation (min)  44 min    Activity Tolerance  Patient tolerated treatment well;Treatment limited secondary to medical complications (Comment)    Behavior During Therapy  [facility] for tasks assessed/performed       Past Medical History:  Diagnosis Date  . Chronic kidney disease     Past Surgical History:  Procedure Laterality Date  . APPENDECTOMY    . BACK SURGERY    . COLONOSCOPY WITH PROPOFOL N/A 05/03/2015   Procedure: COLONOSCOPY WITH PROPOFOL;  Surgeon: Manya Silvas, MD;  Location: [facility] ENDOSCOPY;  Service: Endoscopy;  Laterality: N/A;  . FRACTURE SURGERY    . TONSILLECTOMY    . VASECTOMY      There were no vitals filed for this visit.  Subjective Assessment - 10/29/17 0805    Subjective  Patient reports soreness in total body from overdoing it two days ago.  After PT mowed lawn, went swimming, and practiced golf swing.     Pertinent History  Patient had surgery on L shoulder 15 years ago, R shoulder bicep tear occurred last year. MRI on R shoulder showed three muscles having tears and complete tear of small head of bicep. Left shoulder started hurting more than R shoulder. MRI of cervical neck showed two ruptured discs and pinching. Surgery was 09/01/17 anterior approach discectomy ACDF. After surgery c5 palsy of L arm. After a week later R arm no longer worked either.  Started being able to move R arm about a week ago overhead. Wants to return to playing with grandkids  and playing golf. 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     How long can you sit comfortably?  aches after a while (20 minutes)    How long can you stand comfortably?  n/a    How long can you walk comfortably?  n/a    Diagnostic tests  MRI, surgery    Patient Stated Goals  play with grandchildren, reduce pain, return to golf and work    Currently in Pain?  Yes    Pain Score  4     Pain Location  Shoulder    Pain Orientation  Right;Left    Pain Descriptors / Indicators  Sore    Pain Type  Chronic pain    Pain Onset  More than a month ago    Pain Frequency  Constant       Supine manual              L and R UE Nerve glides Median 15x, radial 15x             SB stretch with overpressure at shoulder 3x20 seconds             Rotation stretch with overpressure at shoulder 3x20 seconds             First rib  mobilizations 2x15 seconds             Suboccipital release 4x20 seconds             STM 4 minutes cervical paraspinals and upper trap. Noted trigger points and limited tissue mobility of L cervical and superomedial border of scapula.    Supine: TherEx Chin Tucks 10x  PNF pattern D1 and D2 15x each arm and each pattern.     Snow angel abduction 10x with 10 second hold at end range             Overhead AROM flexion 10x with 10 second hold at end range   Seated Seated arm ER IR (hands on knees, hands behind back) 2x15 Shoulder circles 15x forward and back                          PT Education - 10/29/17 0820    Education Details  exercise technique, use of heat to decrease soreness     Person(s) Educated  Patient    Methods  Explanation;Demonstration;Verbal cues    Comprehension  Verbalized understanding;Returned demonstration       PT Short Term Goals - 10/14/17 1213      PT SHORT TERM GOAL #1   Title  Patient will be independent in home exercise program to improve strength/mobility for better functional independence with ADLs     Baseline  HEP given     Time  2    Period  Weeks    Status  New    Target Date  10/28/17      PT SHORT TERM GOAL #2   Title  Patient will report a worst pain of 6/10 on VAS in bilateal shoulders  to improve tolerance with ADLs and reduced symptoms with activities.     Baseline  6/4: 9/10     Time  2    Period  Weeks    Status  New    Target Date  10/28/17      PT SHORT TERM GOAL #3   Title  Patient will improve shoulder AROM to > 80 degrees of flexion, scaption, and abduction for improved ability to perform overhead activities.    Baseline  6/4: L abd: 32 flexion 35    Time  2    Period  Weeks    Status  New    Target Date  10/28/17        PT Long Term Goals - 10/14/17 1219      PT LONG TERM GOAL #1   Title  Patient will improve shoulder AROM to > 120 degrees of flexion, scaption, and abduction for improved ability to perform overhead activities.    Baseline  6/4: L flexion 35, abduction 32    Time  4    Period  Weeks    Status  New    Target Date  11/11/17      PT LONG TERM GOAL #2   Title  Patient will decrease Quick DASH score by > 8 points (51.1%)  demonstrating reduced self-reported upper extremity disability.    Baseline  6/4: 59.1%    Time  4    Period  Weeks    Status  New    Target Date  11/11/17      PT LONG TERM GOAL #3   Title  Patient will decrease the NDI score <35% to improve functional cervical mobility and quality of life.  Baseline  6/4: 50%    Time  4    Period  Weeks    Status  New    Target Date  11/11/17      PT LONG TERM GOAL #4   Title  Patient will report a worst pain of 3/10 on VAS in bilateral shoulders to improve tolerance with ADLs and reduced symptoms with activities.     Baseline  6/4: 9/10     Time  4    Period  Weeks    Status  New    Target Date  11/11/17      PT LONG TERM GOAL #5   Title  Patient will improve BUE strength to 4/5 to improve functional mobility, ROM, and return to iADLs, leisure activities, and work.      Baseline  6/4: L 2+/5     Time  4    Period  Weeks    Status  New    Target Date  11/11/17            Plan - 10/29/17 0827    Clinical Impression Statement  Patient reports increased soreness from overdoing it on Monday. Muscle tissue limited in length bilaterally in cervical and upper thoracic region resulting in increased tension of upper body. Patient demonstrates good carryover from previous sessions demonstrating good body mechanics of PNF patterning and AROM in supine position. Pt will benefit from continued skilled PT interventions for decreased pain and improved ROM and functional use of BUEs.     Rehab Potential  Fair    Clinical Impairments Affecting Rehab Potential  (+) age, education level, (-) hx of shoulder problems    PT Frequency  2x / week    PT Duration  4 weeks    PT Treatment/Interventions  ADLs/Self Care Home Management;Aquatic Therapy;Biofeedback;Cryotherapy;Electrical Stimulation;Iontophoresis 4mg /ml Dexamethasone;Moist Heat;Traction;Ultrasound;Therapeutic activities;Therapeutic exercise;Neuromuscular re-education;Manual techniques;Patient/family education;Passive range of motion;Dry needling;Taping;Splinting;Energy conservation    PT Next Visit Plan  review HEP, STM upper trap, Ionto/estim?     PT Home Exercise Plan  see sheet    Consulted and Agree with Plan of Care  Patient       Patient will benefit from skilled therapeutic intervention in order to improve the following deficits and impairments:  Decreased activity tolerance, Decreased endurance, Decreased coordination, Decreased mobility, Decreased range of motion, Decreased strength, Hypomobility, Impaired flexibility, Impaired perceived functional ability, Increased muscle spasms, Increased fascial restricitons, Impaired sensation, Impaired UE functional use, Postural dysfunction, Improper body mechanics, Pain  Visit Diagnosis: Radiculopathy, cervical region  Muscle strength reduced  Muscle weakness  (generalized)     Problem List There are no active problems to display for this patient.  Janna Arch, PT, DPT   10/29/2017, 8:45 AM  [place] MAIN [facility] SERVICES [address] [place], [place], [address] Phone: [phone]   Fax:  [phone]  Name: Philip Manning MRN: [id number] Date of Birth: 1960/10/23

## 2017-10-30 DIAGNOSIS — M25511 Pain in right shoulder: Secondary | ICD-10-CM | POA: Diagnosis not present

## 2017-10-30 DIAGNOSIS — M19012 Primary osteoarthritis, left shoulder: Secondary | ICD-10-CM | POA: Diagnosis not present

## 2017-10-30 DIAGNOSIS — M7541 Impingement syndrome of right shoulder: Secondary | ICD-10-CM | POA: Diagnosis not present

## 2017-10-30 DIAGNOSIS — M25512 Pain in left shoulder: Secondary | ICD-10-CM | POA: Diagnosis not present

## 2017-10-30 DIAGNOSIS — M7551 Bursitis of right shoulder: Secondary | ICD-10-CM | POA: Diagnosis not present

## 2017-11-04 ENCOUNTER — Encounter: Payer: 59 | Admitting: Occupational Therapy

## 2017-11-04 ENCOUNTER — Encounter: Payer: 59 | Admitting: Physical Therapy

## 2017-11-06 ENCOUNTER — Encounter: Payer: 59 | Admitting: Physical Therapy

## 2017-11-10 ENCOUNTER — Encounter: Payer: 59 | Admitting: Occupational Therapy

## 2017-11-10 ENCOUNTER — Ambulatory Visit: Payer: 59 | Attending: Neurosurgery

## 2017-11-10 DIAGNOSIS — M5412 Radiculopathy, cervical region: Secondary | ICD-10-CM | POA: Diagnosis not present

## 2017-11-10 DIAGNOSIS — M6281 Muscle weakness (generalized): Secondary | ICD-10-CM | POA: Insufficient documentation

## 2017-11-10 NOTE — Therapy (Signed)
[place] MAIN [facility] SERVICES [address] [place], [place], [address] Phone: [phone]   Fax:  [phone]  Physical Therapy Treatment Physical Therapy Progress Note   Dates of reporting period 10/14/17   to  11/10/17  Patient Details  Name: Philip Manning MRN: [id number] Date of Birth: Mar 08, 1961 No data recorded  Encounter Date: 11/10/2017  PT End of Session - 11/10/17 1544    Visit Number  6    Number of Visits  16    Date for PT Re-Evaluation  12/08/17    Authorization Type  6/10 (next 1/10)    PT Start Time  1432    PT Stop Time  1516    PT Time Calculation (min)  44 min    Activity Tolerance  Patient tolerated treatment well    Behavior During Therapy  [facility] for tasks assessed/performed       Past Medical History:  Diagnosis Date  . Chronic kidney disease     Past Surgical History:  Procedure Laterality Date  . APPENDECTOMY    . BACK SURGERY    . COLONOSCOPY WITH PROPOFOL N/A 05/03/2015   Procedure: COLONOSCOPY WITH PROPOFOL;  Surgeon: Manya Silvas, MD;  Location: [facility] ENDOSCOPY;  Service: Endoscopy;  Laterality: N/A;  . FRACTURE SURGERY    . TONSILLECTOMY    . VASECTOMY      There were no vitals filed for this visit.  Subjective Assessment - 11/10/17 1436    Subjective  Patient went to beach, started work today for first day reports feeling sore. Played golf twice at beach. Still cant lift arm sideways but can lift if forward. Right arm feels weak still but normal movement.     Pertinent History  Patient had surgery on L shoulder 15 years ago, R shoulder bicep tear occurred last year. MRI on R shoulder showed three muscles having tears and complete tear of small head of bicep. Left shoulder started hurting more than R shoulder. MRI of cervical neck showed two ruptured discs and pinching. Surgery was 09/01/17 anterior approach discectomy ACDF. After surgery c5 palsy of L arm. After a week later R arm no longer worked  either.  Started being able to move R arm about a week ago overhead. Wants to return to playing with grandkids and playing golf. 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     How long can you sit comfortably?  aches after a while (20 minutes)    How long can you stand comfortably?  n/a    How long can you walk comfortably?  n/a    Diagnostic tests  MRI, surgery    Patient Stated Goals  play with grandchildren, reduce pain, return to golf and work    Currently in Pain?  No/denies       HEP  VAS: 3/10 in the mornings  Shoulder AROM  standing Right Left  Shoulder Flexion 179 150 slight scapular shrug  Shoulder Abduction 161 74  ER    IR     Supine  Right Left  Shoulder Flexion WFL 168  Shoulder Abduction WFL 179  ER WFL 75  IR WFL 73       QuickDash= 29.5 NDI: 22% BUE strength   Right Left  Upper trap 4+/5 4+/5  Biceps 4-/5 3/5  Triceps 4-/5 3/5  Shoulder abd 4/5 2+/5  Shoulder flexion 4/5 3/5  Finger Abd    Finger Add  Gets some numbness in R arm occasionally    Cervical towel extension 10x 5 seconds Cervical towel side bend 10 x 5 seconds.   Sidleying: L arm abduction 2lb weight thumb up 10x, thumb horizontal 10x unable to perform to 90 degrees. Bent elbow abduction 10x no weight   Seated:  Retractions 10x cues for retraction and depression of scapula.  Abduction bent arm 10x cues keeping shoulder depressed   Posterior trunk extension over towel roll in chair for posture.   Patient's condition has the potential to improve in response to therapy. Maximum improvement is yet to be obtained. The anticipated improvement is attainable and reasonable in a generally predictable time. Start date of reporting period 10/14/17 end date of reporting period 11/10/17. Patient reports that he has noticed improve ROM and strength in bilateral arms.                      PT Education - 11/10/17 1448    Education Details  goal progression,  POC , cervical mobility     Person(s) Educated  Patient    Methods  Explanation;Demonstration;Verbal cues    Comprehension  Verbalized understanding;Returned demonstration       PT Short Term Goals - 11/10/17 1450      PT SHORT TERM GOAL #1   Title  Patient will be independent in home exercise program to improve strength/mobility for better functional independence with ADLs    Baseline  HEP compliant    Time  2    Period  Weeks    Status  Achieved      PT SHORT TERM GOAL #2   Title  Patient will report a worst pain of 6/10 on VAS in bilateal shoulders  to improve tolerance with ADLs and reduced symptoms with activities.     Baseline  6/4: 9/10  7/1: 3/10     Time  2    Period  Weeks    Status  Achieved      PT SHORT TERM GOAL #3   Title  Patient will improve shoulder AROM to > 80 degrees of flexion, scaption, and abduction for improved ability to perform overhead activities.    Baseline  6/4: L abd: 32 flexion 35 7/1: flexion 150, abduction 74    Time  2    Period  Weeks    Status  Partially Met    Target Date  11/24/17        PT Long Term Goals - 11/10/17 1451      PT LONG TERM GOAL #1   Title  Patient will improve shoulder AROM to > 120 degrees of flexion, scaption, and abduction for improved ability to perform overhead activities.    Baseline  6/4: L flexion 35, abduction 32 7/1: L flexion 150 abduction 74     Time  4    Period  Weeks    Status  Partially Met    Target Date  12/08/17      PT LONG TERM GOAL #2   Title  Patient will decrease Quick DASH score by > 8 points (51.1%)  demonstrating reduced self-reported upper extremity disability.    Baseline  6/4: 59.1% 7/1: 29.5%    Time  4    Period  Weeks    Status  Achieved      PT LONG TERM GOAL #3   Title  Patient will decrease the NDI score <35% to improve functional cervical mobility and quality of life.  Baseline  6/4: 50%: 7/1: 22%    Time  4    Period  Weeks    Status  Achieved      PT LONG TERM  GOAL #4   Title  Patient will report a worst pain of 3/10 on VAS in bilateral shoulders to improve tolerance with ADLs and reduced symptoms with activities.     Baseline  6/4: 9/10  7/1: 3/10     Time  4    Period  Weeks    Status  Partially Met      PT LONG TERM GOAL #5   Title  Patient will improve BUE strength to 4/5 to improve functional mobility, ROM, and return to iADLs, leisure activities, and work.     Baseline  6/4: L 2+/5  7/1: L abduction 2+/5, all other 3/5    Time  4    Period  Weeks    Status  Partially Met    Target Date  12/08/17            Plan - 11/10/17 1554    Clinical Impression Statement  Patient demonstrates progression towards/meeting goals at this time with improved ROM of LUE of flexion 150 and abduction 74 against gravity. NDI 22%, QuickDash 29.5%, muscle strength improving within functional ROM. Patient improving in all aspects with improved ROM against and without gravity. Slight scapular hiking with musculature weakness and limited ROM against gravity. Patient's condition has the potential to improve in response to therapy. Maximum improvement is yet to be obtained. The anticipated improvement is attainable and reasonable in a generally predictable time. Pt will benefit from continued skilled PT interventions for decreased pain and improved ROM and functional use of BUEs.     Rehab Potential  Fair    Clinical Impairments Affecting Rehab Potential  (+) age, education level, (-) hx of shoulder problems    PT Frequency  2x / week    PT Duration  4 weeks    PT Treatment/Interventions  ADLs/Self Care Home Management;Aquatic Therapy;Biofeedback;Cryotherapy;Electrical Stimulation;Iontophoresis 8m/ml Dexamethasone;Moist Heat;Traction;Ultrasound;Therapeutic activities;Therapeutic exercise;Neuromuscular re-education;Manual techniques;Patient/family education;Passive range of motion;Dry needling;Taping;Splinting;Energy conservation    PT Next Visit Plan  deltoid  strengthening posture     PT Home Exercise Plan  see sheet    Consulted and Agree with Plan of Care  Patient       Patient will benefit from skilled therapeutic intervention in order to improve the following deficits and impairments:  Decreased activity tolerance, Decreased endurance, Decreased coordination, Decreased mobility, Decreased range of motion, Decreased strength, Hypomobility, Impaired flexibility, Impaired perceived functional ability, Increased muscle spasms, Increased fascial restricitons, Impaired sensation, Impaired UE functional use, Postural dysfunction, Improper body mechanics, Pain  Visit Diagnosis: Radiculopathy, cervical region  Muscle strength reduced  Muscle weakness (generalized)     Problem List There are no active problems to display for this patient.  MJanna Arch PT, DPT   11/10/2017, 3:55 PM  [place] [facility] [address] [place] [address]Phone: [phone]  Fax:  [phone] Name: RBRISTON LAXMRN: [id number] of Birth: 115-Jan-1962

## 2017-11-18 ENCOUNTER — Encounter: Payer: Self-pay | Admitting: Physical Therapy

## 2017-11-18 ENCOUNTER — Ambulatory Visit: Payer: 59 | Admitting: Physical Therapy

## 2017-11-18 ENCOUNTER — Encounter: Payer: 59 | Admitting: Occupational Therapy

## 2017-11-18 DIAGNOSIS — M5412 Radiculopathy, cervical region: Secondary | ICD-10-CM | POA: Diagnosis not present

## 2017-11-18 DIAGNOSIS — M6281 Muscle weakness (generalized): Secondary | ICD-10-CM

## 2017-11-18 NOTE — Therapy (Signed)
[place] MAIN [facility] SERVICES [address] [place], [place], [address] Phone: [phone]   Fax:  [phone]  Physical Therapy Treatment  Patient Details  Name: Philip Manning MRN: [id number] Date of Birth: 1960-06-02 No data recorded  Encounter Date: 11/18/2017  PT End of Session - 11/18/17 1350    Visit Number  7    Number of Visits  16    Date for PT Re-Evaluation  12/08/17    Authorization Type  1/10    PT Start Time  3244    PT Stop Time  1429    PT Time Calculation (min)  40 min    Activity Tolerance  Patient tolerated treatment well    Behavior During Therapy  [facility] for tasks assessed/performed       Past Medical History:  Diagnosis Date  . Chronic kidney disease     Past Surgical History:  Procedure Laterality Date  . APPENDECTOMY    . BACK SURGERY    . COLONOSCOPY WITH PROPOFOL N/A 05/03/2015   Procedure: COLONOSCOPY WITH PROPOFOL;  Surgeon: Manya Silvas, MD;  Location: [facility] ENDOSCOPY;  Service: Endoscopy;  Laterality: N/A;  . FRACTURE SURGERY    . TONSILLECTOMY    . VASECTOMY      There were no vitals filed for this visit.  Subjective Assessment - 11/18/17 1353    Subjective  Pt reports his neck is feeling tight "like a crick in my neck" after playing golf x1 since last session.  Pt reports 3-4x/day he has spontaneous onset of BUE soreness and weakness which resolves within a few seconds.  This has been going on for a few weeks and is not accompanied with any additional symptoms.      Pertinent History  Patient had surgery on L shoulder 15 years ago, R shoulder bicep tear occurred last year. MRI on R shoulder showed three muscles having tears and complete tear of small head of bicep. Left shoulder started hurting more than R shoulder. MRI of cervical neck showed two ruptured discs and pinching. Surgery was 09/01/17 anterior approach discectomy ACDF. After surgery c5 palsy of L arm. After a week later R arm no longer  worked either.  Started being able to move R arm about a week ago overhead. Wants to return to playing with grandkids and playing golf. 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     How long can you sit comfortably?  aches after a while (20 minutes)    How long can you stand comfortably?  n/a    How long can you walk comfortably?  n/a    Diagnostic tests  MRI, surgery    Patient Stated Goals  play with grandchildren, reduce pain, return to golf and work    Currently in Pain?  Yes    Pain Score  5     Pain Location  Neck    Pain Descriptors / Indicators  Sore    Pain Type  Acute pain    Pain Onset  In the past 7 days       TREATMENT  Noted pt rotating at trunk to look to his side rather than cervical rotation.  Seated Bil cervical rotation, sidebend, flex, and extension x10 each direction  STM to Bil cervical paraspinals, avoiding known cyst along R paraspinals.  Cervical towel extension 20x 5 seconds  Cervical towel rotation 10 x 5 seconds each direction  Seated retractions 10x cues for retraction  and depression of scapula. Repeated with GTB x10.  Sidelying: L arm abduction 2lb weight thumb up 10x with fatigue noted at end of set  Repeated with thumb horizontal 10x  Supine L shoulder F with 2# weight x15, 4# x15  Chin Tucks 20x in supine  Supine PNF pattern D1 and D2 15x each arm and each pattern.?  Supine snow angel abduction 10x with 10 second hold at end range  Suboccipital release 4x20 seconds  L first rib mobilizations 2x20 seconds in sitting  Pt denies pain throughout session                          PT Education - 11/18/17 1350    Education Details  Exercise technique    Person(s) Educated  Patient    Methods  Explanation;Demonstration;Verbal cues    Comprehension  Verbalized understanding;Returned demonstration;Verbal cues required;Need further instruction       PT Short Term Goals - 11/10/17 1450      PT SHORT TERM GOAL #1    Title  Patient will be independent in home exercise program to improve strength/mobility for better functional independence with ADLs    Baseline  HEP compliant    Time  2    Period  Weeks    Status  Achieved      PT SHORT TERM GOAL #2   Title  Patient will report a worst pain of 6/10 on VAS in bilateal shoulders  to improve tolerance with ADLs and reduced symptoms with activities.     Baseline  6/4: 9/10  7/1: 3/10     Time  2    Period  Weeks    Status  Achieved      PT SHORT TERM GOAL #3   Title  Patient will improve shoulder AROM to > 80 degrees of flexion, scaption, and abduction for improved ability to perform overhead activities.    Baseline  6/4: L abd: 32 flexion 35 7/1: flexion 150, abduction 74    Time  2    Period  Weeks    Status  Partially Met    Target Date  11/24/17        PT Long Term Goals - 11/10/17 1451      PT LONG TERM GOAL #1   Title  Patient will improve shoulder AROM to > 120 degrees of flexion, scaption, and abduction for improved ability to perform overhead activities.    Baseline  6/4: L flexion 35, abduction 32 7/1: L flexion 150 abduction 74     Time  4    Period  Weeks    Status  Partially Met    Target Date  12/08/17      PT LONG TERM GOAL #2   Title  Patient will decrease Quick DASH score by > 8 points (51.1%)  demonstrating reduced self-reported upper extremity disability.    Baseline  6/4: 59.1% 7/1: 29.5%    Time  4    Period  Weeks    Status  Achieved      PT LONG TERM GOAL #3   Title  Patient will decrease the NDI score <35% to improve functional cervical mobility and quality of life.     Baseline  6/4: 50%: 7/1: 22%    Time  4    Period  Weeks    Status  Achieved      PT LONG TERM GOAL #4   Title  Patient will report  a worst pain of 3/10 on VAS in bilateral shoulders to improve tolerance with ADLs and reduced symptoms with activities.     Baseline  6/4: 9/10  7/1: 3/10     Time  4    Period  Weeks    Status  Partially Met       PT LONG TERM GOAL #5   Title  Patient will improve BUE strength to 4/5 to improve functional mobility, ROM, and return to iADLs, leisure activities, and work.     Baseline  6/4: L 2+/5  7/1: L abduction 2+/5, all other 3/5    Time  4    Period  Weeks    Status  Partially Met    Target Date  12/08/17            Plan - 11/18/17 1357    Clinical Impression Statement  Noted pt rotating at trunk to look to his side rather than cerical rotation, thus started session with cervical AROM for dissociation from thoracic spine.  Pt reported soreness in posterior neck and reported some relief following STM to cervical paraspinals.  Followed this with MWM to cervical spine for improved mobility and ROM.  Pt demonstrates decreased fatigue with snow angel abduction BUE this session compared to previous sessions. Pt will benefit from skilled PT interventions for improved strength, ROM, and functional independence.      Rehab Potential  Fair    Clinical Impairments Affecting Rehab Potential  (+) age, education level, (-) hx of shoulder problems    PT Frequency  2x / week    PT Duration  4 weeks    PT Treatment/Interventions  ADLs/Self Care Home Management;Aquatic Therapy;Biofeedback;Cryotherapy;Electrical Stimulation;Iontophoresis 63m/ml Dexamethasone;Moist Heat;Traction;Ultrasound;Therapeutic activities;Therapeutic exercise;Neuromuscular re-education;Manual techniques;Patient/family education;Passive range of motion;Dry needling;Taping;Splinting;Energy conservation    PT Next Visit Plan  deltoid strengthening posture     PT Home Exercise Plan  see sheet    Consulted and Agree with Plan of Care  Patient       Patient will benefit from skilled therapeutic intervention in order to improve the following deficits and impairments:  Decreased activity tolerance, Decreased endurance, Decreased coordination, Decreased mobility, Decreased range of motion, Decreased strength, Hypomobility, Impaired flexibility,  Impaired perceived functional ability, Increased muscle spasms, Increased fascial restricitons, Impaired sensation, Impaired UE functional use, Postural dysfunction, Improper body mechanics, Pain  Visit Diagnosis: Radiculopathy, cervical region  Muscle strength reduced  Muscle weakness (generalized)     Problem List There are no active problems to display for this patient.  ACollie SiadPT, DPT 11/18/2017, 2:28 PM  [facility]ount [place]MAIN [facility] HospitalSERVICES [address] [address]Phone: [phone]  Fax:  [phone] Name: Philip CREQUEMRN: [id number] of Birth: 111-Apr-1962

## 2017-11-20 ENCOUNTER — Encounter: Payer: 59 | Admitting: Occupational Therapy

## 2017-11-20 ENCOUNTER — Ambulatory Visit: Payer: 59 | Admitting: Physical Therapy

## 2017-11-20 ENCOUNTER — Encounter: Payer: Self-pay | Admitting: Physical Therapy

## 2017-11-20 DIAGNOSIS — M5412 Radiculopathy, cervical region: Secondary | ICD-10-CM

## 2017-11-20 DIAGNOSIS — M6281 Muscle weakness (generalized): Secondary | ICD-10-CM

## 2017-11-20 NOTE — Therapy (Signed)
[place] MAIN [facility] SERVICES [address] [place], [place], [address] Phone: [phone]   Fax:  [phone]  Physical Therapy Treatment  Patient Details  Name: Philip Manning MRN: [id number] Date of Birth: 08-Mar-1961 No data recorded  Encounter Date: 11/20/2017  PT End of Session - 11/20/17 1300    Visit Number  8    Number of Visits  16    Date for PT Re-Evaluation  12/08/17    Authorization Type  2/10    PT Start Time  1300    PT Stop Time  1341    PT Time Calculation (min)  41 min    Activity Tolerance  Patient tolerated treatment well    Behavior During Therapy  [facility] for tasks assessed/performed       Past Medical History:  Diagnosis Date  . Chronic kidney disease     Past Surgical History:  Procedure Laterality Date  . APPENDECTOMY    . BACK SURGERY    . COLONOSCOPY WITH PROPOFOL N/A 05/03/2015   Procedure: COLONOSCOPY WITH PROPOFOL;  Surgeon: Manya Silvas, MD;  Location: [facility] ENDOSCOPY;  Service: Endoscopy;  Laterality: N/A;  . FRACTURE SURGERY    . TONSILLECTOMY    . VASECTOMY      There were no vitals filed for this visit.  Subjective Assessment - 11/20/17 1303    Subjective  Pt reports he did some lifting with 5# dumbbells yesterday and is sore (bicep curls and chest press).  Pt reports and demonstrates decreased stiffness in neck as he has been moving his neck intentionally at least 1x/hr.  Pt is planning to go to  the pool later today and will exercise his arms some then.  Pt called MD today who is planning to sign the form for pt to return to full time work on Monday.     Pertinent History  Patient had surgery on L shoulder 15 years ago, R shoulder bicep tear occurred last year. MRI on R shoulder showed three muscles having tears and complete tear of small head of bicep. Left shoulder started hurting more than R shoulder. MRI of cervical neck showed two ruptured discs and pinching. Surgery was 09/01/17 anterior  approach discectomy ACDF. After surgery c5 palsy of L arm. After a week later R arm no longer worked either.  Started being able to move R arm about a week ago overhead. Wants to return to playing with grandkids and playing golf. 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     How long can you sit comfortably?  aches after a while (20 minutes)    How long can you stand comfortably?  n/a    How long can you walk comfortably?  n/a    Diagnostic tests  MRI, surgery    Patient Stated Goals  play with grandchildren, reduce pain, return to golf and work    Currently in Pain?  No/denies         TREATMENT   Cervical towel extension 10x 5 seconds   Cervical towel rotation 10 x 5 seconds each direction   Seated retractions 2x15 with GTB with cues for exaggerated scapular squeeze   Standing low rows 2x15 with GTB with cues for exaggerated scapular squeeze   Prone L low trap arm lifts without weight 2x10 with significant fatigue noted   Prone L scapular squeeze for mid trap strengthening 2x15   Sidelying: L arm abduction 2lb weight thumb up x10,  x15 with fatigue noted at end of set   Supine L shoulder F with 4# weight x20   Chin Tucks 20x in supine   Standing PNF pattern D1 and D2 15x each arm and each pattern?   Assessment of standing L shoulder F AROM: Pt able to perform x3 without compensatory patterns after verbal cues. However, pt fatigues after this and is unable to perform additional reps.   Assessment of standing L shoulder Abd AROM: Pt able to complete to ~110 deg without compensatory behaviors x3   Supine L shoulder D1 and D2 PNF pattern with 2# 2x20 each                        PT Education - 11/20/17 1300    Education Details  Exercise technique    Person(s) Educated  Patient    Methods  Explanation;Demonstration;Verbal cues    Comprehension  Verbalized understanding;Returned demonstration;Verbal cues required;Need further instruction        PT Short Term Goals - 11/10/17 1450      PT SHORT TERM GOAL #1   Title  Patient will be independent in home exercise program to improve strength/mobility for better functional independence with ADLs    Baseline  HEP compliant    Time  2    Period  Weeks    Status  Achieved      PT SHORT TERM GOAL #2   Title  Patient will report a worst pain of 6/10 on VAS in bilateal shoulders  to improve tolerance with ADLs and reduced symptoms with activities.     Baseline  6/4: 9/10  7/1: 3/10     Time  2    Period  Weeks    Status  Achieved      PT SHORT TERM GOAL #3   Title  Patient will improve shoulder AROM to > 80 degrees of flexion, scaption, and abduction for improved ability to perform overhead activities.    Baseline  6/4: L abd: 32 flexion 35 7/1: flexion 150, abduction 74    Time  2    Period  Weeks    Status  Partially Met    Target Date  11/24/17        PT Long Term Goals - 11/10/17 1451      PT LONG TERM GOAL #1   Title  Patient will improve shoulder AROM to > 120 degrees of flexion, scaption, and abduction for improved ability to perform overhead activities.    Baseline  6/4: L flexion 35, abduction 32 7/1: L flexion 150 abduction 74     Time  4    Period  Weeks    Status  Partially Met    Target Date  12/08/17      PT LONG TERM GOAL #2   Title  Patient will decrease Quick DASH score by > 8 points (51.1%)  demonstrating reduced self-reported upper extremity disability.    Baseline  6/4: 59.1% 7/1: 29.5%    Time  4    Period  Weeks    Status  Achieved      PT LONG TERM GOAL #3   Title  Patient will decrease the NDI score <35% to improve functional cervical mobility and quality of life.     Baseline  6/4: 50%: 7/1: 22%    Time  4    Period  Weeks    Status  Achieved      PT LONG TERM  GOAL #4   Title  Patient will report a worst pain of 3/10 on VAS in bilateral shoulders to improve tolerance with ADLs and reduced symptoms with activities.     Baseline  6/4:  9/10  7/1: 3/10     Time  4    Period  Weeks    Status  Partially Met      PT LONG TERM GOAL #5   Title  Patient will improve BUE strength to 4/5 to improve functional mobility, ROM, and return to iADLs, leisure activities, and work.     Baseline  6/4: L 2+/5  7/1: L abduction 2+/5, all other 3/5    Time  4    Period  Weeks    Status  Partially Met    Target Date  12/08/17            Plan - 11/20/17 1305    Clinical Impression Statement  Pt presents with improved cerivcal AROM and reports decreased stiffness.  He is sore from weight lifting the day prior.  Focus was on posture and strengthening this session. Pt demonstrated signficant fatigue with L low trap strengthening in prone.  Assessment of standing AROM of L shoulder F and Abd revealed compensatory behaviors after ~3 reps.  Therefore, decision was made to continue progressing strength in supine.  Pt will benefit from continued skilled PT interventions for improved ROM, strength, and functional use.      Rehab Potential  Fair    Clinical Impairments Affecting Rehab Potential  (+) age, education level, (-) hx of shoulder problems    PT Frequency  2x / week    PT Duration  4 weeks    PT Treatment/Interventions  ADLs/Self Care Home Management;Aquatic Therapy;Biofeedback;Cryotherapy;Electrical Stimulation;Iontophoresis 52m/ml Dexamethasone;Moist Heat;Traction;Ultrasound;Therapeutic activities;Therapeutic exercise;Neuromuscular re-education;Manual techniques;Patient/family education;Passive range of motion;Dry needling;Taping;Splinting;Energy conservation    PT Next Visit Plan  deltoid strengthening posture     PT Home Exercise Plan  see sheet    Consulted and Agree with Plan of Care  Patient       Patient will benefit from skilled therapeutic intervention in order to improve the following deficits and impairments:  Decreased activity tolerance, Decreased endurance, Decreased coordination, Decreased mobility, Decreased range of  motion, Decreased strength, Hypomobility, Impaired flexibility, Impaired perceived functional ability, Increased muscle spasms, Increased fascial restricitons, Impaired sensation, Impaired UE functional use, Postural dysfunction, Improper body mechanics, Pain  Visit Diagnosis: Radiculopathy, cervical region  Muscle strength reduced  Muscle weakness (generalized)     Problem List There are no active problems to display for this patient.   ACollie SiadPT, DPT 11/20/2017, 1:42 PM  CSt. CharlesMAIN RThe Surgery Center Of AthensSERVICES [address] [place] [address]Phone: [phone]  Fax:  [phone] Name: Philip ALOISIMRN: [id number] of Birth: 123-Dec-1962

## 2017-11-24 ENCOUNTER — Ambulatory Visit: Payer: 59

## 2017-11-24 DIAGNOSIS — M5412 Radiculopathy, cervical region: Secondary | ICD-10-CM

## 2017-11-24 DIAGNOSIS — M6281 Muscle weakness (generalized): Secondary | ICD-10-CM

## 2017-11-24 NOTE — Therapy (Signed)
[place] MAIN [facility] SERVICES [address] [place], [place], [address] Phone: [phone]   Fax:  [phone]  Physical Therapy Treatment  Patient Details  Name: Philip Manning MRN: [id number] Date of Birth: Apr 06, 1961 No data recorded  Encounter Date: 11/24/2017  PT End of Session - 11/24/17 1742    Visit Number  9    Number of Visits  16    Date for PT Re-Evaluation  12/08/17    Authorization Type  3/10    PT Start Time  1647    PT Stop Time  1732    PT Time Calculation (min)  45 min    Activity Tolerance  Patient tolerated treatment well    Behavior During Therapy  [facility] for tasks assessed/performed       Past Medical History:  Diagnosis Date  . Chronic kidney disease     Past Surgical History:  Procedure Laterality Date  . APPENDECTOMY    . BACK SURGERY    . COLONOSCOPY WITH PROPOFOL N/A 05/03/2015   Procedure: COLONOSCOPY WITH PROPOFOL;  Surgeon: Manya Silvas, MD;  Location: [facility] ENDOSCOPY;  Service: Endoscopy;  Laterality: N/A;  . FRACTURE SURGERY    . TONSILLECTOMY    . VASECTOMY      There were no vitals filed for this visit.  Subjective Assessment - 11/24/17 1741    Subjective  Patient reports he has been able to pick up his grandchildren (the smaller ones, not the larger ones), notices he is still weak/has challenges with bringing his arm out to the side. Has been compliant with HEP.     Pertinent History  Patient had surgery on L shoulder 15 years ago, R shoulder bicep tear occurred last year. MRI on R shoulder showed three muscles having tears and complete tear of small head of bicep. Left shoulder started hurting more than R shoulder. MRI of cervical neck showed two ruptured discs and pinching. Surgery was 09/01/17 anterior approach discectomy ACDF. After surgery c5 palsy of L arm. After a week later R arm no longer worked either.  Started being able to move R arm about a week ago overhead. Wants to return to playing  with grandkids and playing golf. 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     How long can you sit comfortably?  aches after a while (20 minutes)    How long can you stand comfortably?  n/a    How long can you walk comfortably?  n/a    Diagnostic tests  MRI, surgery    Patient Stated Goals  play with grandchildren, reduce pain, return to golf and work    Currently in Pain?  No/denies      seated Seated rows: GTB single arm 15x L and R UE; 2 sets  21's; scapular shrug with fatigue of LUE    supine Scapular stabilization against pertubations 3x20 seconds   Robber stretch 2x30 seconds  Side bend stretch 3x20 seconds  Rotation stretch 3x20 seconds Suboccipital release 2x30 seconds Bicep tendon trigger point/STM release with movement 2 minutes  Quadruped: Weight over bilateral shoulders 30 seconds Weight shift with UE claps to PT x 10 Bosu ball (black side up) static balance hold 30 seconds Bosu ball (black side up) forward back shift 10x Bosu ball (black side up) side to side 10x  Standing:  Wall walks YTB cues for keeping elbow and wrist in line 8x Wall push up with a plus 15x [employer]  circles with shoulder retracted (L) 10x clockwise, 10x counterclockwise Abduction ball wall walks 5x, challenge with eccentric portion of return                     PT Education - 11/24/17 1741    Education Details  exercise technique, posture, positioning    Person(s) Educated  Patient    Methods  Explanation;Demonstration;Verbal cues    Comprehension  Verbalized understanding;Returned demonstration;Need further instruction       PT Short Term Goals - 11/10/17 1450      PT SHORT TERM GOAL #1   Title  Patient will be independent in home exercise program to improve strength/mobility for better functional independence with ADLs    Baseline  HEP compliant    Time  2    Period  Weeks    Status  Achieved      PT SHORT TERM GOAL #2   Title  Patient will report a  worst pain of 6/10 on VAS in bilateal shoulders  to improve tolerance with ADLs and reduced symptoms with activities.     Baseline  6/4: 9/10  7/1: 3/10     Time  2    Period  Weeks    Status  Achieved      PT SHORT TERM GOAL #3   Title  Patient will improve shoulder AROM to > 80 degrees of flexion, scaption, and abduction for improved ability to perform overhead activities.    Baseline  6/4: L abd: 32 flexion 35 7/1: flexion 150, abduction 74    Time  2    Period  Weeks    Status  Partially Met    Target Date  11/24/17        PT Long Term Goals - 11/10/17 1451      PT LONG TERM GOAL #1   Title  Patient will improve shoulder AROM to > 120 degrees of flexion, scaption, and abduction for improved ability to perform overhead activities.    Baseline  6/4: L flexion 35, abduction 32 7/1: L flexion 150 abduction 74     Time  4    Period  Weeks    Status  Partially Met    Target Date  12/08/17      PT LONG TERM GOAL #2   Title  Patient will decrease Quick DASH score by > 8 points (51.1%)  demonstrating reduced self-reported upper extremity disability.    Baseline  6/4: 59.1% 7/1: 29.5%    Time  4    Period  Weeks    Status  Achieved      PT LONG TERM GOAL #3   Title  Patient will decrease the NDI score <35% to improve functional cervical mobility and quality of life.     Baseline  6/4: 50%: 7/1: 22%    Time  4    Period  Weeks    Status  Achieved      PT LONG TERM GOAL #4   Title  Patient will report a worst pain of 3/10 on VAS in bilateral shoulders to improve tolerance with ADLs and reduced symptoms with activities.     Baseline  6/4: 9/10  7/1: 3/10     Time  4    Period  Weeks    Status  Partially Met      PT LONG TERM GOAL #5   Title  Patient will improve BUE strength to 4/5 to improve functional mobility, ROM, and return  to iADLs, leisure activities, and work.     Baseline  6/4: L 2+/5  7/1: L abduction 2+/5, all other 3/5    Time  4    Period  Weeks    Status   Partially Met    Target Date  12/08/17            Plan - 11/24/17 1744    Clinical Impression Statement  Patient presents with continued progression of available ROM with decreased compensatory patterning. Patient demonstrates shrug with abduction when patient is fatigued. Decreased stiffness of cervical spine after manual. Pt will benefit from continued skilled PT interventions for improved ROM, strength, and functional use.     Rehab Potential  Fair    Clinical Impairments Affecting Rehab Potential  (+) age, education level, (-) hx of shoulder problems    PT Frequency  2x / week    PT Duration  4 weeks    PT Treatment/Interventions  ADLs/Self Care Home Management;Aquatic Therapy;Biofeedback;Cryotherapy;Electrical Stimulation;Iontophoresis 74m/ml Dexamethasone;Moist Heat;Traction;Ultrasound;Therapeutic activities;Therapeutic exercise;Neuromuscular re-education;Manual techniques;Patient/family education;Passive range of motion;Dry needling;Taping;Splinting;Energy conservation    PT Next Visit Plan  deltoid strengthening posture     PT Home Exercise Plan  see sheet    Consulted and Agree with Plan of Care  Patient       Patient will benefit from skilled therapeutic intervention in order to improve the following deficits and impairments:  Decreased activity tolerance, Decreased endurance, Decreased coordination, Decreased mobility, Decreased range of motion, Decreased strength, Hypomobility, Impaired flexibility, Impaired perceived functional ability, Increased muscle spasms, Increased fascial restricitons, Impaired sensation, Impaired UE functional use, Postural dysfunction, Improper body mechanics, Pain  Visit Diagnosis: Radiculopathy, cervical region  Muscle strength reduced  Muscle weakness (generalized)     Problem List There are no active problems to display for this patient.  MJanna Arch PT, DPT   11/24/2017, 5:44 PM  [place]ed[place]MAIN  RFa[facility] CampusSERVICES [address] [address]Phone: [phone]  Fax:  [phone] Name: Philip PRUSTMRN: [id number] of Birth: 1Jan 14, 1962

## 2017-11-25 ENCOUNTER — Ambulatory Visit: Payer: 59

## 2017-11-25 ENCOUNTER — Encounter: Payer: 59 | Admitting: Occupational Therapy

## 2017-11-27 ENCOUNTER — Encounter: Payer: 59 | Admitting: Occupational Therapy

## 2017-12-02 ENCOUNTER — Ambulatory Visit: Payer: 59

## 2017-12-02 ENCOUNTER — Encounter: Payer: 59 | Admitting: Occupational Therapy

## 2017-12-02 DIAGNOSIS — M5412 Radiculopathy, cervical region: Secondary | ICD-10-CM | POA: Diagnosis not present

## 2017-12-02 DIAGNOSIS — M6281 Muscle weakness (generalized): Secondary | ICD-10-CM

## 2017-12-02 NOTE — Therapy (Signed)
[place] MAIN [facility] SERVICES [address] [place], [place], [address] Phone: [phone]   Fax:  [phone]  Physical Therapy Treatment  Patient Details  Name: Philip Manning MRN: [id number] Date of Birth: 28-Apr-1961 No data recorded  Encounter Date: 12/02/2017  PT End of Session - 12/02/17 1733    Visit Number  10    Number of Visits  16    Date for PT Re-Evaluation  12/08/17    Authorization Type  4/10    PT Start Time  1645    PT Stop Time  1729    PT Time Calculation (min)  44 min    Activity Tolerance  Patient tolerated treatment well    Behavior During Therapy  [facility] for tasks assessed/performed       Past Medical History:  Diagnosis Date  . Chronic kidney disease     Past Surgical History:  Procedure Laterality Date  . APPENDECTOMY    . BACK SURGERY    . COLONOSCOPY WITH PROPOFOL N/A 05/03/2015   Procedure: COLONOSCOPY WITH PROPOFOL;  Surgeon: Manya Silvas, MD;  Location: [facility] ENDOSCOPY;  Service: Endoscopy;  Laterality: N/A;  . FRACTURE SURGERY    . TONSILLECTOMY    . VASECTOMY      There were no vitals filed for this visit.  Subjective Assessment - 12/02/17 1650    Subjective  Patient reports using 10lb dumbbells and pulleys everyday for 4-5 minutes. Compliant with HEP. Gets occasional heaviness in arms.     Pertinent History  Patient had surgery on L shoulder 15 years ago, R shoulder bicep tear occurred last year. MRI on R shoulder showed three muscles having tears and complete tear of small head of bicep. Left shoulder started hurting more than R shoulder. MRI of cervical neck showed two ruptured discs and pinching. Surgery was 09/01/17 anterior approach discectomy ACDF. After surgery c5 palsy of L arm. After a week later R arm no longer worked either.  Started being able to move R arm about a week ago overhead. Wants to return to playing with grandkids and playing golf. 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     How long can you sit comfortably?  aches after a while (20 minutes)    How long can you stand comfortably?  n/a    How long can you walk comfortably?  n/a    Diagnostic tests  MRI, surgery    Patient Stated Goals  play with grandchildren, reduce pain, return to golf and work    Currently in Pain?  No/denies       seated 21's bent arm 21's straight arm; scapular shrug with fatigue of LUE    supine Scapular stabilization against pertubations 3x20 seconds Scapular punches 10x with 7lb weight x 2 sets   Bench press 7lb dumbbells 10x x 2 sets Tricep close grip press 7lb dumbbells 15x  Robber stretch 2x30 seconds  Side bend stretch 3x20 seconds  Rotation stretch 3x20 seconds Suboccipital release 2x30 seconds Bicep tendon trigger point/STM release with movement 2 minutes  L arm snow angel 10x   Quadruped:  Weight shift with UE claps to PT x 10 Bosu ball (black side up) static balance hold 30 seconds Bosu ball (black side up) forward back shift 10x Bosu ball (black side up) side to side 10x  Bosu ball (black side up) circles 10x clockwise, 10x counterclockwise  Standing:  boucing wall walks at 170 degrees flexion  4x, 100 degrees flexion 4x   Clockwise ball circles 10x, counterclockwise 10x with cues for scapular compression.   Attempt at body blade: RUE 10x, LUE partial to 70 degrees flexion with compensatory patterned so stopped  .                         PT Education - 12/02/17 1733    Education Details  exercise technique, posture, weight lifting technique     Person(s) Educated  Patient    Methods  Explanation;Demonstration;Verbal cues    Comprehension  Verbalized understanding;Returned demonstration       PT Short Term Goals - 11/10/17 1450      PT SHORT TERM GOAL #1   Title  Patient will be independent in home exercise program to improve strength/mobility for better functional independence with ADLs    Baseline  HEP compliant     Time  2    Period  Weeks    Status  Achieved      PT SHORT TERM GOAL #2   Title  Patient will report a worst pain of 6/10 on VAS in bilateal shoulders  to improve tolerance with ADLs and reduced symptoms with activities.     Baseline  6/4: 9/10  7/1: 3/10     Time  2    Period  Weeks    Status  Achieved      PT SHORT TERM GOAL #3   Title  Patient will improve shoulder AROM to > 80 degrees of flexion, scaption, and abduction for improved ability to perform overhead activities.    Baseline  6/4: L abd: 32 flexion 35 7/1: flexion 150, abduction 74    Time  2    Period  Weeks    Status  Partially Met    Target Date  11/24/17        PT Long Term Goals - 11/10/17 1451      PT LONG TERM GOAL #1   Title  Patient will improve shoulder AROM to > 120 degrees of flexion, scaption, and abduction for improved ability to perform overhead activities.    Baseline  6/4: L flexion 35, abduction 32 7/1: L flexion 150 abduction 74     Time  4    Period  Weeks    Status  Partially Met    Target Date  12/08/17      PT LONG TERM GOAL #2   Title  Patient will decrease Quick DASH score by > 8 points (51.1%)  demonstrating reduced self-reported upper extremity disability.    Baseline  6/4: 59.1% 7/1: 29.5%    Time  4    Period  Weeks    Status  Achieved      PT LONG TERM GOAL #3   Title  Patient will decrease the NDI score <35% to improve functional cervical mobility and quality of life.     Baseline  6/4: 50%: 7/1: 22%    Time  4    Period  Weeks    Status  Achieved      PT LONG TERM GOAL #4   Title  Patient will report a worst pain of 3/10 on VAS in bilateral shoulders to improve tolerance with ADLs and reduced symptoms with activities.     Baseline  6/4: 9/10  7/1: 3/10     Time  4    Period  Weeks    Status  Partially Met  PT LONG TERM GOAL #5   Title  Patient will improve BUE strength to 4/5 to improve functional mobility, ROM, and return to iADLs, leisure activities, and work.      Baseline  6/4: L 2+/5  7/1: L abduction 2+/5, all other 3/5    Time  4    Period  Weeks    Status  Partially Met    Target Date  12/08/17            Plan - 12/02/17 1737    Clinical Impression Statement  Patient has full ROM without compensatory patterning when performing gravity assistance in supine however upon standing compensatory shrug of LUE noted in abduction. Patient demonstrating improved strength in supine and standing positions. Pt will benefit from continued skilled PT interventions for improved ROM, strength, and functional use    Rehab Potential  Fair    Clinical Impairments Affecting Rehab Potential  (+) age, education level, (-) hx of shoulder problems    PT Frequency  2x / week    PT Duration  4 weeks    PT Treatment/Interventions  ADLs/Self Care Home Management;Aquatic Therapy;Biofeedback;Cryotherapy;Electrical Stimulation;Iontophoresis 58m/ml Dexamethasone;Moist Heat;Traction;Ultrasound;Therapeutic activities;Therapeutic exercise;Neuromuscular re-education;Manual techniques;Patient/family education;Passive range of motion;Dry needling;Taping;Splinting;Energy conservation    PT Next Visit Plan  deltoid strengthening posture     PT Home Exercise Plan  see sheet    Consulted and Agree with Plan of Care  Patient       Patient will benefit from skilled therapeutic intervention in order to improve the following deficits and impairments:  Decreased activity tolerance, Decreased endurance, Decreased coordination, Decreased mobility, Decreased range of motion, Decreased strength, Hypomobility, Impaired flexibility, Impaired perceived functional ability, Increased muscle spasms, Increased fascial restricitons, Impaired sensation, Impaired UE functional use, Postural dysfunction, Improper body mechanics, Pain  Visit Diagnosis: Radiculopathy, cervical region  Muscle strength reduced  Muscle weakness (generalized)     Problem List There are no active problems to  display for this patient.  MJanna Arch PT, DPT   12/02/2017, 5:38 PM  CHaverford CollegeMAIN RMiami Lakes Surgery Center LtdSERVICES [address] [place] [address]Phone: [phone]  Fax:  [phone] Name: Philip PAVICHMRN: [id number] of Birth: 1August 29, 1962

## 2017-12-04 ENCOUNTER — Encounter: Payer: 59 | Admitting: Occupational Therapy

## 2017-12-04 ENCOUNTER — Ambulatory Visit: Payer: 59

## 2017-12-09 ENCOUNTER — Ambulatory Visit: Payer: 59

## 2017-12-09 ENCOUNTER — Encounter: Payer: 59 | Admitting: Occupational Therapy

## 2017-12-09 DIAGNOSIS — M6281 Muscle weakness (generalized): Secondary | ICD-10-CM

## 2017-12-09 DIAGNOSIS — M5412 Radiculopathy, cervical region: Secondary | ICD-10-CM

## 2017-12-09 NOTE — Therapy (Signed)
[place] MAIN [facility] SERVICES [address] [place], [place], [address] Phone: [phone]   Fax:  [phone]  Physical Therapy Treatment Physical Therapy Progress Note   Dates of reporting period  11/10/17  to   12/09/17  Patient Details  Name: Philip Manning MRN: [id number] Date of Birth: May 26, 1960 No data recorded  Encounter Date: 12/09/2017  PT End of Session - 12/09/17 1738    Visit Number  11    Number of Visits  16    Date for PT Re-Evaluation  01/06/18    Authorization Type  5/10    PT Start Time  1642    PT Stop Time  1726    PT Time Calculation (min)  44 min    Activity Tolerance  Patient tolerated treatment well    Behavior During Therapy  [facility] for tasks assessed/performed       Past Medical History:  Diagnosis Date  . Chronic kidney disease     Past Surgical History:  Procedure Laterality Date  . APPENDECTOMY    . BACK SURGERY    . COLONOSCOPY WITH PROPOFOL N/A 05/03/2015   Procedure: COLONOSCOPY WITH PROPOFOL;  Surgeon: Manya Silvas, MD;  Location: [facility] ENDOSCOPY;  Service: Endoscopy;  Laterality: N/A;  . FRACTURE SURGERY    . TONSILLECTOMY    . VASECTOMY      There were no vitals filed for this visit.  Subjective Assessment - 12/09/17 1734    Subjective  Patient reports having difficulty when swinging golf club due to not being able to perform the full lateral swing portion. Has been compliant with HEP, Gets stiffness/pain when first waking up of 5/10 but after HEP and hot shower pain is eliminated.     Pertinent History  Patient had surgery on L shoulder 15 years ago, R shoulder bicep tear occurred last year. MRI on R shoulder showed three muscles having tears and complete tear of small head of bicep. Left shoulder started hurting more than R shoulder. MRI of cervical neck showed two ruptured discs and pinching. Surgery was 09/01/17 anterior approach discectomy ACDF. After surgery c5 palsy of L arm. After a week  later R arm no longer worked either.  Started being able to move R arm about a week ago overhead. Wants to return to playing with grandkids and playing golf. 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     How long can you sit comfortably?  aches after a while (20 minutes)    How long can you stand comfortably?  n/a    How long can you walk comfortably?  n/a    Diagnostic tests  MRI, surgery    Patient Stated Goals  play with grandchildren, reduce pain, return to golf and work    Currently in Pain?  No/denies       Recert or d/c Shoulder AROM Standing AROM Right Left  Shoulder Flexion 150 170  Shoulder Abduction 165 164  ER WFL WFL  IR 44 45    MMT Right Left  Shoulder Flexion 4+/5 3+/5  Shoulder Abduction 4/5 3/5  ER 4+/5 3+/5  IR 4+/5 4-/5  Bicep 4/5 4/5  Tricep 4/5 4/5          VAS: 5/10 when first waking up.   Supine Side bend stretch 3x20 seconds  Rotation stretch 3x20 seconds Suboccipital release 2x30 seconds   Cable:  LUE Lateral Straight arm lat pull down 7.5 lb 15x cues  for keeping scapular retracted and depressed Bilateral straight arm lat pulldown #22.5; 12 x cues for elbow positioning L ER no weight with towel between elbow and body 10x with challenging for body mechanics L IR #2.5 10x towel between elbow and body  L Single arm row #7.5 10x tactile cueing for positioning.     Bent over flys 10x ,1 set    Seated: External rotation : GTB with tactile assistance for positioning and to perform from 90 as start, to challenging for patient  So decreased down to RTB  Gets numbness and cold in R hand.     Decrease to 1x/week         Patient's condition has the potential to improve in response to therapy. Maximum improvement is yet to be obtained. The anticipated improvement is attainable and reasonable in a generally predictable time. Start date of reporting period 11/10/17 end date of reporting period  12/09/17. Patient reports he is having  increased ROM however continues to be limited by abduction movements in his daily life.               PT Education - 12/09/17 1735    Education Details  POC, goal progression, exercise technique     Person(s) Educated  Patient    Methods  Explanation;Demonstration;Verbal cues    Comprehension  Verbalized understanding;Returned demonstration       PT Short Term Goals - 12/09/17 1744      PT SHORT TERM GOAL #1   Title  Patient will be independent in home exercise program to improve strength/mobility for better functional independence with ADLs    Baseline  HEP compliant    Time  2    Period  Weeks    Status  Achieved      PT SHORT TERM GOAL #2   Title  Patient will report a worst pain of 6/10 on VAS in bilateal shoulders  to improve tolerance with ADLs and reduced symptoms with activities.     Baseline  6/4: 9/10  7/1: 3/10     Time  2    Period  Weeks    Status  Achieved      PT SHORT TERM GOAL #3   Title  Patient will improve shoulder AROM to > 80 degrees of flexion, scaption, and abduction for improved ability to perform overhead activities.    Baseline  6/4: L abd: 32 flexion 35 7/1: flexion 150, abduction 74 7/30: bilateral >150    Time  2    Period  Weeks    Status  Achieved        PT Long Term Goals - 12/09/17 1744      PT LONG TERM GOAL #1   Title  Patient will improve shoulder AROM to > 120 degrees of flexion, scaption, and abduction for improved ability to perform overhead activities.    Baseline  6/4: L flexion 35, abduction 32 7/1: L flexion 150 abduction 74 ; 7/30: abd 164, flex 170    Time  4    Period  Weeks    Status  Achieved      PT LONG TERM GOAL #2   Title  Patient will decrease Quick DASH score by > 8 points (51.1%)  demonstrating reduced self-reported upper extremity disability.    Baseline  6/4: 59.1% 7/1: 29.5%    Time  4    Period  Weeks    Status  Achieved      PT LONG TERM GOAL #  3   Title  Patient will decrease the NDI score  <35% to improve functional cervical mobility and quality of life.     Baseline  6/4: 50%: 7/1: 22%    Time  4    Period  Weeks    Status  Achieved      PT LONG TERM GOAL #4   Title  Patient will report a worst pain of 3/10 on VAS in bilateral shoulders to improve tolerance with ADLs and reduced symptoms with activities.     Baseline  6/4: 9/10  7/1: 3/10 7/30: 5/10 inthe morning    Time  4    Period  Weeks    Status  Partially Met    Target Date  01/06/18      PT LONG TERM GOAL #5   Title  Patient will improve BUE strength to 4/5 to improve functional mobility, ROM, and return to iADLs, leisure activities, and work.     Baseline  6/4: L 2+/5  7/1: L abduction 2+/5, all other 3/5 7/30:  L abduction 3/5 flexion 3+/5, ER 3+/5, IR 4-/5    Time  4    Period  Weeks    Status  Partially Met    Target Date  01/06/18            Plan - 12/09/17 1743    Clinical Impression Statement   Patient is reaching majority of goals at this time with improved ROM and pain levels. Patient continues to have weakness of medial deltoid that impacts daily life. ROM is now functional however patient breaks with muscle testing in LUE with abduction and external rotation being the most impacted patterns of movement at this time. Due to patient's progress we will decrease frequency to 1x/week as we continue to progress left shoulder strengthening and postural control.  Patient's condition has the potential to improve in response to therapy. Maximum improvement is yet to be obtained. The anticipated improvement is attainable and reasonable in a generally predictable time. Pt will benefit from continued skilled PT interventions for improved posture, strength, and functional use    Rehab Potential  Fair    Clinical Impairments Affecting Rehab Potential  (+) age, education level, (-) hx of shoulder problems    PT Frequency  1x / week    PT Duration  4 weeks    PT Treatment/Interventions  ADLs/Self Care Home  Management;Aquatic Therapy;Biofeedback;Cryotherapy;Electrical Stimulation;Iontophoresis 16m/ml Dexamethasone;Moist Heat;Traction;Ultrasound;Therapeutic activities;Therapeutic exercise;Neuromuscular re-education;Manual techniques;Patient/family education;Passive range of motion;Dry needling;Taping;Splinting;Energy conservation    PT Next Visit Plan  deltoid strengthening posture     PT Home Exercise Plan  see sheet    Consulted and Agree with Plan of Care  Patient       Patient will benefit from skilled therapeutic intervention in order to improve the following deficits and impairments:  Decreased activity tolerance, Decreased endurance, Decreased coordination, Decreased mobility, Decreased range of motion, Decreased strength, Hypomobility, Impaired flexibility, Impaired perceived functional ability, Increased muscle spasms, Increased fascial restricitons, Impaired sensation, Impaired UE functional use, Postural dysfunction, Improper body mechanics, Pain  Visit Diagnosis: Radiculopathy, cervical region  Muscle weakness (generalized)     Problem List There are no active problems to display for this patient.  MJanna Arch PT, DPT   12/09/2017, 5:48 PM  [place] [facility] [address]RR[address] [place] [address]Phone: [phone]  Fax:  [phone] Name: Philip DHONDTMRN: [id number] of Birth: [birth year]

## 2017-12-11 ENCOUNTER — Encounter: Payer: 59 | Admitting: Occupational Therapy

## 2017-12-11 ENCOUNTER — Ambulatory Visit: Payer: 59

## 2017-12-16 ENCOUNTER — Ambulatory Visit: Payer: 59 | Attending: Neurosurgery

## 2017-12-16 DIAGNOSIS — M6281 Muscle weakness (generalized): Secondary | ICD-10-CM | POA: Diagnosis present

## 2017-12-16 DIAGNOSIS — M5412 Radiculopathy, cervical region: Secondary | ICD-10-CM

## 2017-12-16 NOTE — Therapy (Signed)
[place] MAIN [facility] SERVICES [address] [place], [place], [address] Phone: [phone]   Fax:  [phone]  Physical Therapy Treatment  Patient Details  Name: Philip Manning MRN: [id number] Date of Birth: 08-May-1961 No data recorded  Encounter Date: 12/16/2017  PT End of Session - 12/16/17 1649    Visit Number  12    Number of Visits  16    Date for PT Re-Evaluation  01/06/18    Authorization Type  6/10    PT Start Time  6720    PT Stop Time  1729    PT Time Calculation (min)  45 min    Activity Tolerance  Patient tolerated treatment well    Behavior During Therapy  [facility] for tasks assessed/performed       Past Medical History:  Diagnosis Date  . Chronic kidney disease     Past Surgical History:  Procedure Laterality Date  . APPENDECTOMY    . BACK SURGERY    . COLONOSCOPY WITH PROPOFOL N/A 05/03/2015   Procedure: COLONOSCOPY WITH PROPOFOL;  Surgeon: Manya Silvas, MD;  Location: [facility] ENDOSCOPY;  Service: Endoscopy;  Laterality: N/A;  . FRACTURE SURGERY    . TONSILLECTOMY    . VASECTOMY      There were no vitals filed for this visit.  Subjective Assessment - 12/16/17 1647    Subjective  Patient reports this morning waking up with decreased pain , first morning waking up with 2/10 pain insteady of 4/10 pain. Reports no pain now. Has been compliant with HEP, has been swamped at work.     Pertinent History  Patient had surgery on L shoulder 15 years ago, R shoulder bicep tear occurred last year. MRI on R shoulder showed three muscles having tears and complete tear of small head of bicep. Left shoulder started hurting more than R shoulder. MRI of cervical neck showed two ruptured discs and pinching. Surgery was 09/01/17 anterior approach discectomy ACDF. After surgery c5 palsy of L arm. After a week later R arm no longer worked either.  Started being able to move R arm about a week ago overhead. Wants to return to playing with  grandkids and playing golf. 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     How long can you sit comfortably?  aches after a while (20 minutes)    How long can you stand comfortably?  n/a    How long can you walk comfortably?  n/a    Diagnostic tests  MRI, surgery    Patient Stated Goals  play with grandchildren, reduce pain, return to golf and work    Currently in Pain?  No/denies      SciFit 3 min forward 3 min back lvl 4; cues for upright posture shoulders back and abdomen tightened.   Standing:  Body blade: flexion 10x L and R arm to eye level  Body blade: abduction 10x L and R to 90 degrees   Scarecrows 10x  Wall walks RTB 5x  Abduction ball wall walks 5x, challenge with eccentric portion of return  Side stepping with ball passes 10x length of wall : arms at 120 degrees of flexion   Prone:   Modified Plank (on knees): up up down down   5 modified pushups ( on knees)  Modified plank : arm abduction 10x each arm.   Cable:  LUE Lateral Straight arm lat pull down 7.5 lb 15x cues for keeping scapular retracted  and depressed 2 sets  L ER #2.5  2x10with towel between elbow and body 10x with challenging for body mechanics L IR #7.5 10x towel between elbow and body  L Single arm row #7.5 10x tactile cueing for positioning.   Lawnmower (d2 PNF pattern) 2.5# 10x LUE   Seated:  IR towel stretch with cervical overpressure                     PT Education - 12/16/17 1649    Education Details  exercise technique, posture, strengthening    Person(s) Educated  Patient    Methods  Explanation;Demonstration;Verbal cues    Comprehension  Verbalized understanding;Returned demonstration       PT Short Term Goals - 12/09/17 1744      PT SHORT TERM GOAL #1   Title  Patient will be independent in home exercise program to improve strength/mobility for better functional independence with ADLs    Baseline  HEP compliant    Time  2    Period  Weeks     Status  Achieved      PT SHORT TERM GOAL #2   Title  Patient will report a worst pain of 6/10 on VAS in bilateal shoulders  to improve tolerance with ADLs and reduced symptoms with activities.     Baseline  6/4: 9/10  7/1: 3/10     Time  2    Period  Weeks    Status  Achieved      PT SHORT TERM GOAL #3   Title  Patient will improve shoulder AROM to > 80 degrees of flexion, scaption, and abduction for improved ability to perform overhead activities.    Baseline  6/4: L abd: 32 flexion 35 7/1: flexion 150, abduction 74 7/30: bilateral >150    Time  2    Period  Weeks    Status  Achieved        PT Long Term Goals - 12/09/17 1744      PT LONG TERM GOAL #1   Title  Patient will improve shoulder AROM to > 120 degrees of flexion, scaption, and abduction for improved ability to perform overhead activities.    Baseline  6/4: L flexion 35, abduction 32 7/1: L flexion 150 abduction 74 ; 7/30: abd 164, flex 170    Time  4    Period  Weeks    Status  Achieved      PT LONG TERM GOAL #2   Title  Patient will decrease Quick DASH score by > 8 points (51.1%)  demonstrating reduced self-reported upper extremity disability.    Baseline  6/4: 59.1% 7/1: 29.5%    Time  4    Period  Weeks    Status  Achieved      PT LONG TERM GOAL #3   Title  Patient will decrease the NDI score <35% to improve functional cervical mobility and quality of life.     Baseline  6/4: 50%: 7/1: 22%    Time  4    Period  Weeks    Status  Achieved      PT LONG TERM GOAL #4   Title  Patient will report a worst pain of 3/10 on VAS in bilateral shoulders to improve tolerance with ADLs and reduced symptoms with activities.     Baseline  6/4: 9/10  7/1: 3/10 7/30: 5/10 inthe morning    Time  4    Period  Weeks      Status  Partially Met    Target Date  01/06/18      PT LONG TERM GOAL #5   Title  Patient will improve BUE strength to 4/5 to improve functional mobility, ROM, and return to iADLs, leisure activities, and  work.     Baseline  6/4: L 2+/5  7/1: L abduction 2+/5, all other 3/5 7/30:  L abduction 3/5 flexion 3+/5, ER 3+/5, IR 4-/5    Time  4    Period  Weeks    Status  Partially Met    Target Date  01/06/18            Plan - 12/16/17 1735    Clinical Impression Statement  Patient's strength is progressing in all musculature of L GH joint however abduction continues to be most limited actively due to fatigue. Body weight results in hike of L shoulder with fatigue in prone/quadruped position. Pt will benefit from continued skilled PT interventions for improved ROM, strength, and functional use    Rehab Potential  Fair    Clinical Impairments Affecting Rehab Potential  (+) age, education level, (-) hx of shoulder problems    PT Frequency  1x / week    PT Duration  4 weeks    PT Treatment/Interventions  ADLs/Self Care Home Management;Aquatic Therapy;Biofeedback;Cryotherapy;Electrical Stimulation;Iontophoresis 4mg/ml Dexamethasone;Moist Heat;Traction;Ultrasound;Therapeutic activities;Therapeutic exercise;Neuromuscular re-education;Manual techniques;Patient/family education;Passive range of motion;Dry needling;Taping;Splinting;Energy conservation    PT Next Visit Plan  deltoid strengthening posture     PT Home Exercise Plan  see sheet    Consulted and Agree with Plan of Care  Patient       Patient will benefit from skilled therapeutic intervention in order to improve the following deficits and impairments:  Decreased activity tolerance, Decreased endurance, Decreased coordination, Decreased mobility, Decreased range of motion, Decreased strength, Hypomobility, Impaired flexibility, Impaired perceived functional ability, Increased muscle spasms, Increased fascial restricitons, Impaired sensation, Impaired UE functional use, Postural dysfunction, Improper body mechanics, Pain  Visit Diagnosis: Radiculopathy, cervical region  Muscle weakness (generalized)  Muscle strength reduced     Problem  List There are no active problems to display for this patient.  Marina Moser, PT, DPT   12/16/2017, 5:35 PM  [place] [facility] MA[facility] REHAB SERVICES [address] [place], [place], [address] Phone: [phone]   Fax:  [phone]  Name: Albi C Finkbiner MRN: [id number] Date of Birth: 06/28/1960   

## 2017-12-18 DIAGNOSIS — R030 Elevated blood-pressure reading, without diagnosis of hypertension: Secondary | ICD-10-CM | POA: Diagnosis not present

## 2017-12-18 DIAGNOSIS — H1031 Unspecified acute conjunctivitis, right eye: Secondary | ICD-10-CM | POA: Diagnosis not present

## 2017-12-22 ENCOUNTER — Ambulatory Visit: Payer: 59

## 2017-12-22 DIAGNOSIS — M5412 Radiculopathy, cervical region: Secondary | ICD-10-CM

## 2017-12-22 DIAGNOSIS — M6281 Muscle weakness (generalized): Secondary | ICD-10-CM

## 2017-12-22 NOTE — Therapy (Signed)
[place] [facility] SERVICES [address] [place], [place], [address] Phone: [phone]   Fax:  [phone]  Physical Therapy Treatment  Patient Details  Name: Philip Manning MRN: [id number] Date of Birth: 22-Jun-1960 No data recorded  Encounter Date: 12/22/2017  PT End of Session - 12/22/17 1734    Visit Number  13    Number of Visits  16    Date for PT Re-Evaluation  01/06/18    Authorization Type  7/10    PT Start Time  7673    PT Stop Time  1815    PT Time Calculation (min)  45 min    Activity Tolerance  Patient tolerated treatment well    Behavior During Therapy  [facility] for tasks assessed/performed       Past Medical History:  Diagnosis Date  . Chronic kidney disease     Past Surgical History:  Procedure Laterality Date  . APPENDECTOMY    . BACK SURGERY    . COLONOSCOPY WITH PROPOFOL N/A 05/03/2015   Procedure: COLONOSCOPY WITH PROPOFOL;  Surgeon: Manya Silvas, MD;  Location: [facility] ENDOSCOPY;  Service: Endoscopy;  Laterality: N/A;  . FRACTURE SURGERY    . TONSILLECTOMY    . VASECTOMY      There were no vitals filed for this visit.  Subjective Assessment - 12/22/17 1732    Subjective  Patient reports having pink eye last week but has been taking his medication and reports he is no longer contagious. Reports work has been very busy. Waking up pain is staying at the lower 2/10 levels.     Pertinent History  Patient had surgery on L shoulder 15 years ago, R shoulder bicep tear occurred last year. MRI on R shoulder showed three muscles having tears and complete tear of small head of bicep. Left shoulder started hurting more than R shoulder. MRI of cervical neck showed two ruptured discs and pinching. Surgery was 09/01/17 anterior approach discectomy ACDF. After surgery c5 palsy of L arm. After a week later R arm no longer worked either.  Started being able to move R arm about a week ago overhead. Wants to return to playing with  grandkids and playing golf. 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     How long can you sit comfortably?  aches after a while (20 minutes)    How long can you stand comfortably?  n/a    How long can you walk comfortably?  n/a    Diagnostic tests  MRI, surgery    Patient Stated Goals  play with grandchildren, reduce pain, return to golf and work    Currently in Pain?  No/denies       SciFit 3 min forward 3 min back lvl 4; cues for upright posture shoulders back and abdomen tightened.    Standing:  Body blade: flexion 10x L and R arm to eye level   Body blade: abduction 10x L and R to 90 degrees    Scarecrows 10x   Wall posture 2x60 seconds   5lb dumbell front lat raises 10x; mod tactile cueing for body mechanics    Prone:    Modified Plank (on knees): up up down down 8x each direction  Modified plank to childpose 30 second holds    5 modified pushups ( on knees)   Modified plank : arm abduction 10x each arm.    Cable:  LUE Lateral Straight arm lat pull down 7.5  lb 15x cues for keeping scapular retracted and depressed 2 sets   Bent over row 17.5 lb 10x , 22.5 10x    L ER #2.5  2x10with towel between elbow and body 10x with challenging for body mechanics   Lawnmower (d2 PNF pattern) 2.5# 10x LUE    Supine:  Half foam roller lengthwise under body with robber stretch 60 seconds; 3 trials  Half foam roller lengthwise under body with ER RTB 10x      Seated:   IR towel stretch with cervical overpressure                         PT Education - 12/22/17 1733    Education Details  exercise technique, posture, strengthening    Person(s) Educated  Patient    Methods  Explanation;Demonstration;Verbal cues    Comprehension  Verbalized understanding;Returned demonstration       PT Short Term Goals - 12/09/17 1744      PT SHORT TERM GOAL #1   Title  Patient will be independent in home exercise program to improve strength/mobility  for better functional independence with ADLs    Baseline  HEP compliant    Time  2    Period  Weeks    Status  Achieved      PT SHORT TERM GOAL #2   Title  Patient will report a worst pain of 6/10 on VAS in bilateal shoulders  to improve tolerance with ADLs and reduced symptoms with activities.     Baseline  6/4: 9/10  7/1: 3/10     Time  2    Period  Weeks    Status  Achieved      PT SHORT TERM GOAL #3   Title  Patient will improve shoulder AROM to > 80 degrees of flexion, scaption, and abduction for improved ability to perform overhead activities.    Baseline  6/4: L abd: 32 flexion 35 7/1: flexion 150, abduction 74 7/30: bilateral >150    Time  2    Period  Weeks    Status  Achieved        PT Long Term Goals - 12/09/17 1744      PT LONG TERM GOAL #1   Title  Patient will improve shoulder AROM to > 120 degrees of flexion, scaption, and abduction for improved ability to perform overhead activities.    Baseline  6/4: L flexion 35, abduction 32 7/1: L flexion 150 abduction 74 ; 7/30: abd 164, flex 170    Time  4    Period  Weeks    Status  Achieved      PT LONG TERM GOAL #2   Title  Patient will decrease Quick DASH score by > 8 points (51.1%)  demonstrating reduced self-reported upper extremity disability.    Baseline  6/4: 59.1% 7/1: 29.5%    Time  4    Period  Weeks    Status  Achieved      PT LONG TERM GOAL #3   Title  Patient will decrease the NDI score <35% to improve functional cervical mobility and quality of life.     Baseline  6/4: 50%: 7/1: 22%    Time  4    Period  Weeks    Status  Achieved      PT LONG TERM GOAL #4   Title  Patient will report a worst pain of 3/10 on VAS in bilateral shoulders to improve  tolerance with ADLs and reduced symptoms with activities.     Baseline  6/4: 9/10  7/1: 3/10 7/30: 5/10 inthe morning    Time  4    Period  Weeks    Status  Partially Met    Target Date  01/06/18      PT LONG TERM GOAL #5   Title  Patient will improve  BUE strength to 4/5 to improve functional mobility, ROM, and return to iADLs, leisure activities, and work.     Baseline  6/4: L 2+/5  7/1: L abduction 2+/5, all other 3/5 7/30:  L abduction 3/5 flexion 3+/5, ER 3+/5, IR 4-/5    Time  4    Period  Weeks    Status  Partially Met    Target Date  01/06/18            Plan - 12/23/17 0756    Clinical Impression Statement  Patient strength in gravity resisted positioning is improving with decreased compensatory shrugging. Use of a wall when performing dumbbell interventions allowed for improved muscular control and body mechanics. Will re-assess need for continued therapy next session. Pt will benefit from continued skilled PT interventions for improved ROM, strength, and functional use    Rehab Potential  Fair    Clinical Impairments Affecting Rehab Potential  (+) age, education level, (-) hx of shoulder problems    PT Frequency  1x / week    PT Duration  4 weeks    PT Treatment/Interventions  ADLs/Self Care Home Management;Aquatic Therapy;Biofeedback;Cryotherapy;Electrical Stimulation;Iontophoresis 70m/ml Dexamethasone;Moist Heat;Traction;Ultrasound;Therapeutic activities;Therapeutic exercise;Neuromuscular re-education;Manual techniques;Patient/family education;Passive range of motion;Dry needling;Taping;Splinting;Energy conservation    PT Next Visit Plan  deltoid strengthening posture     PT Home Exercise Plan  see sheet    Consulted and Agree with Plan of Care  Patient       Patient will benefit from skilled therapeutic intervention in order to improve the following deficits and impairments:  Decreased activity tolerance, Decreased endurance, Decreased coordination, Decreased mobility, Decreased range of motion, Decreased strength, Hypomobility, Impaired flexibility, Impaired perceived functional ability, Increased muscle spasms, Increased fascial restricitons, Impaired sensation, Impaired UE functional use, Postural dysfunction, Improper  body mechanics, Pain  Visit Diagnosis: Radiculopathy, cervical region  Muscle weakness (generalized)  Muscle strength reduced     Problem List There are no active problems to display for this patient.  MJanna Arch PT, DPT    12/23/2017, 7:57 AM  [place] [facility] IncSERV[facility] [address] [address]Phone: [phone]  Fax:  [phone] Name: Philip HEIDERMRN: [id number] of Birth: 101/29/1962

## 2017-12-29 ENCOUNTER — Ambulatory Visit: Payer: 59

## 2017-12-29 DIAGNOSIS — D2262 Melanocytic nevi of left upper limb, including shoulder: Secondary | ICD-10-CM | POA: Diagnosis not present

## 2017-12-29 DIAGNOSIS — X32XXXA Exposure to sunlight, initial encounter: Secondary | ICD-10-CM | POA: Diagnosis not present

## 2017-12-29 DIAGNOSIS — M5412 Radiculopathy, cervical region: Secondary | ICD-10-CM | POA: Diagnosis not present

## 2017-12-29 DIAGNOSIS — L570 Actinic keratosis: Secondary | ICD-10-CM | POA: Diagnosis not present

## 2017-12-29 DIAGNOSIS — D2261 Melanocytic nevi of right upper limb, including shoulder: Secondary | ICD-10-CM | POA: Diagnosis not present

## 2017-12-29 DIAGNOSIS — Z85828 Personal history of other malignant neoplasm of skin: Secondary | ICD-10-CM | POA: Diagnosis not present

## 2017-12-29 DIAGNOSIS — M6281 Muscle weakness (generalized): Secondary | ICD-10-CM

## 2017-12-29 NOTE — Therapy (Signed)
[place] [facility] SERVICES [address] [place], [place], [address] Phone: [phone]   Fax:  [phone]  Physical Therapy Treatment  Patient Details  Name: Philip Manning MRN: [id number] Date of Birth: Dec 09, 1960 No data recorded  Encounter Date: 12/29/2017  PT End of Session - 12/29/17 1651    Visit Number  14    Number of Visits  16    Date for PT Re-Evaluation  01/06/18    Authorization Type  8/10    PT Start Time  1646    PT Stop Time  1730    PT Time Calculation (min)  44 min    Activity Tolerance  Patient tolerated treatment well    Behavior During Therapy  [facility] for tasks assessed/performed       Past Medical History:  Diagnosis Date  . Chronic kidney disease     Past Surgical History:  Procedure Laterality Date  . APPENDECTOMY    . BACK SURGERY    . COLONOSCOPY WITH PROPOFOL N/A 05/03/2015   Procedure: COLONOSCOPY WITH PROPOFOL;  Surgeon: Manya Silvas, MD;  Location: [facility] ENDOSCOPY;  Service: Endoscopy;  Laterality: N/A;  . FRACTURE SURGERY    . TONSILLECTOMY    . VASECTOMY      There were no vitals filed for this visit.  Subjective Assessment - 12/29/17 1649    Subjective  Patient reports he went to a funeral today and dermatologist. Saturday he weeded and was very sore yesterday and today. Biceps and wrist musculature most sore. Performed weed wacker.     Pertinent History  Patient had surgery on L shoulder 15 years ago, R shoulder bicep tear occurred last year. MRI on R shoulder showed three muscles having tears and complete tear of small head of bicep. Left shoulder started hurting more than R shoulder. MRI of cervical neck showed two ruptured discs and pinching. Surgery was 09/01/17 anterior approach discectomy ACDF. After surgery c5 palsy of L arm. After a week later R arm no longer worked either.  Started being able to move R arm about a week ago overhead. Wants to return to playing with grandkids and playing  golf. 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     How long can you sit comfortably?  aches after a while (20 minutes)    How long can you stand comfortably?  n/a    How long can you walk comfortably?  n/a    Diagnostic tests  MRI, surgery    Patient Stated Goals  play with grandchildren, reduce pain, return to golf and work    Currently in Pain?  No/denies        SciFit 3 min forward 3 min back lvl 4; cues for upright posture shoulders back and abdomen tightened.    Worst VAS: 2/10 in shoulders   Right Left  Shoulder abd 5/5 4/5  Shoulder flex 5/5 4+/5  Shoulder ext 5/5 4/5  Upper trap 5/5 4/5  Biceps 5/5 4/5  Triceps 5/5 4+/5  Wrist Ext 5/5 4/5  Wrist Flexion 5/5 4/5   QuickDash:general: 0% sport 12.5%     5lb dumbell side raises 15x  10lb dumbbell side raises against wall 10x  Mare Ferrari carry with 10lb dumbbell in 90 90 arm and 25lb dumbbell in straight arm, 30 ft and switch   100 rainbow ball overhead passes to wall with arms in 180 degree flexion  Rainbow ball abduction 90 90 wall glides 10  ft then flexion side step ball glides 10 ft to return to starting point , repeat 5x  Standing 21s with arms straight: abduction 7x, scaption 7x, flexion 7x with opp arm abducted to 90, then switch  Review of HEP and continuation of strengthening                PT Education - 12/29/17 1651    Education Details  exercise technique, posture strengthening.     Person(s) Educated  Patient    Methods  Explanation;Demonstration;Verbal cues    Comprehension  Verbalized understanding;Returned demonstration       PT Short Term Goals - 12/09/17 1744      PT SHORT TERM GOAL #1   Title  Patient will be independent in home exercise program to improve strength/mobility for better functional independence with ADLs    Baseline  HEP compliant    Time  2    Period  Weeks    Status  Achieved      PT SHORT TERM GOAL #2   Title  Patient will report a worst pain of  6/10 on VAS in bilateal shoulders  to improve tolerance with ADLs and reduced symptoms with activities.     Baseline  6/4: 9/10  7/1: 3/10     Time  2    Period  Weeks    Status  Achieved      PT SHORT TERM GOAL #3   Title  Patient will improve shoulder AROM to > 80 degrees of flexion, scaption, and abduction for improved ability to perform overhead activities.    Baseline  6/4: L abd: 32 flexion 35 7/1: flexion 150, abduction 74 7/30: bilateral >150    Time  2    Period  Weeks    Status  Achieved        PT Long Term Goals - 12/29/17 1657      PT LONG TERM GOAL #1   Title  Patient will improve shoulder AROM to > 120 degrees of flexion, scaption, and abduction for improved ability to perform overhead activities.    Baseline  6/4: L flexion 35, abduction 32 7/1: L flexion 150 abduction 74 ; 7/30: abd 164, flex 170    Time  4    Period  Weeks    Status  Achieved      PT LONG TERM GOAL #2   Title  Patient will decrease Quick DASH score by > 8 points (51.1%)  demonstrating reduced self-reported upper extremity disability.    Baseline  6/4: 59.1% 7/1: 29.5%    Time  4    Period  Weeks    Status  Achieved      PT LONG TERM GOAL #3   Title  Patient will decrease the NDI score <35% to improve functional cervical mobility and quality of life.     Baseline  6/4: 50%: 7/1: 22%    Time  4    Period  Weeks    Status  Achieved      PT LONG TERM GOAL #4   Title  Patient will report a worst pain of 3/10 on VAS in bilateral shoulders to improve tolerance with ADLs and reduced symptoms with activities.     Baseline  6/4: 9/10  7/1: 3/10 7/30: 5/10 inthe morning 8/19: 2/10     Time  4    Period  Weeks    Status  Achieved      PT LONG TERM GOAL #5  Title  Patient will improve BUE strength to 4/5 to improve functional mobility, ROM, and return to iADLs, leisure activities, and work.     Baseline  6/4: L 2+/5  7/1: L abduction 2+/5, all other 3/5 7/30:  L abduction 3/5 flexion 3+/5, ER  3+/5, IR 4-/5 : 8/19: L 4/5     Time  4    Period  Weeks    Status  Achieved            Plan - 12/29/17 1732    Clinical Impression Statement  Patient has met all goals at this time and will be discharged. Patient has near full strength with slight deficit of abduction of L shoulder with prolonged duration of hold. Patient understands need for continued compliance with HEP. I will be happy to see patient again in the future as needed.     Rehab Potential  Fair    Clinical Impairments Affecting Rehab Potential  (+) age, education level, (-) hx of shoulder problems    PT Frequency  1x / week    PT Duration  4 weeks    PT Treatment/Interventions  ADLs/Self Care Home Management;Aquatic Therapy;Biofeedback;Cryotherapy;Electrical Stimulation;Iontophoresis 67m/ml Dexamethasone;Moist Heat;Traction;Ultrasound;Therapeutic activities;Therapeutic exercise;Neuromuscular re-education;Manual techniques;Patient/family education;Passive range of motion;Dry needling;Taping;Splinting;Energy conservation    PT Next Visit Plan  deltoid strengthening posture     PT Home Exercise Plan  see sheet    Consulted and Agree with Plan of Care  Patient       Patient will benefit from skilled therapeutic intervention in order to improve the following deficits and impairments:  Decreased activity tolerance, Decreased endurance, Decreased coordination, Decreased mobility, Decreased range of motion, Decreased strength, Hypomobility, Impaired flexibility, Impaired perceived functional ability, Increased muscle spasms, Increased fascial restricitons, Impaired sensation, Impaired UE functional use, Postural dysfunction, Improper body mechanics, Pain  Visit Diagnosis: Radiculopathy, cervical region  Muscle weakness (generalized)  Muscle strength reduced     Problem List There are no active problems to display for this patient.  MJanna Arch PT, DPT   12/29/2017, 5:33 PM  [place]ellesMAIN [facility] CenterSERVICES [address] [address]Phone: [phone]  Fax:  [phone] Name: Philip PAOLOMRN: [id number] of Birth: [birth year]

## 2018-01-05 ENCOUNTER — Ambulatory Visit: Payer: 59

## 2018-03-20 DIAGNOSIS — Z6831 Body mass index (BMI) 31.0-31.9, adult: Secondary | ICD-10-CM | POA: Diagnosis not present

## 2018-03-20 DIAGNOSIS — M4712 Other spondylosis with myelopathy, cervical region: Secondary | ICD-10-CM | POA: Diagnosis not present

## 2018-03-20 DIAGNOSIS — R030 Elevated blood-pressure reading, without diagnosis of hypertension: Secondary | ICD-10-CM | POA: Diagnosis not present

## 2018-05-19 DIAGNOSIS — M545 Low back pain: Secondary | ICD-10-CM | POA: Diagnosis not present

## 2018-05-19 DIAGNOSIS — G8929 Other chronic pain: Secondary | ICD-10-CM | POA: Diagnosis not present

## 2018-05-19 DIAGNOSIS — M5441 Lumbago with sciatica, right side: Secondary | ICD-10-CM | POA: Diagnosis not present

## 2018-05-27 DIAGNOSIS — M5441 Lumbago with sciatica, right side: Secondary | ICD-10-CM | POA: Diagnosis not present

## 2018-05-27 DIAGNOSIS — M545 Low back pain: Secondary | ICD-10-CM | POA: Diagnosis not present

## 2018-06-02 DIAGNOSIS — M545 Low back pain: Secondary | ICD-10-CM | POA: Diagnosis not present

## 2018-06-02 DIAGNOSIS — Z6832 Body mass index (BMI) 32.0-32.9, adult: Secondary | ICD-10-CM | POA: Diagnosis not present

## 2018-06-02 DIAGNOSIS — R030 Elevated blood-pressure reading, without diagnosis of hypertension: Secondary | ICD-10-CM | POA: Diagnosis not present

## 2018-09-23 DIAGNOSIS — D485 Neoplasm of uncertain behavior of skin: Secondary | ICD-10-CM | POA: Diagnosis not present

## 2018-09-23 DIAGNOSIS — D225 Melanocytic nevi of trunk: Secondary | ICD-10-CM | POA: Diagnosis not present

## 2018-09-23 DIAGNOSIS — X32XXXA Exposure to sunlight, initial encounter: Secondary | ICD-10-CM | POA: Diagnosis not present

## 2018-09-23 DIAGNOSIS — D2261 Melanocytic nevi of right upper limb, including shoulder: Secondary | ICD-10-CM | POA: Diagnosis not present

## 2018-09-23 DIAGNOSIS — D170 Benign lipomatous neoplasm of skin and subcutaneous tissue of head, face and neck: Secondary | ICD-10-CM | POA: Diagnosis not present

## 2018-09-23 DIAGNOSIS — L570 Actinic keratosis: Secondary | ICD-10-CM | POA: Diagnosis not present

## 2018-09-29 DIAGNOSIS — M542 Cervicalgia: Secondary | ICD-10-CM | POA: Diagnosis not present

## 2018-09-30 DIAGNOSIS — D485 Neoplasm of uncertain behavior of skin: Secondary | ICD-10-CM | POA: Diagnosis not present

## 2018-09-30 DIAGNOSIS — R208 Other disturbances of skin sensation: Secondary | ICD-10-CM | POA: Diagnosis not present

## 2019-09-13 ENCOUNTER — Other Ambulatory Visit: Payer: Self-pay

## 2019-09-13 ENCOUNTER — Encounter: Payer: Self-pay | Admitting: Emergency Medicine

## 2019-09-13 ENCOUNTER — Emergency Department: Payer: 59

## 2019-09-13 ENCOUNTER — Emergency Department
Admission: EM | Admit: 2019-09-13 | Discharge: 2019-09-13 | Disposition: A | Discharge status: Home / Self Care | Payer: 59 | Attending: Emergency Medicine | Admitting: Emergency Medicine

## 2019-09-13 DIAGNOSIS — R109 Unspecified abdominal pain: Secondary | ICD-10-CM | POA: Diagnosis present

## 2019-09-13 DIAGNOSIS — Z79899 Other long term (current) drug therapy: Secondary | ICD-10-CM | POA: Diagnosis not present

## 2019-09-13 DIAGNOSIS — R110 Nausea: Secondary | ICD-10-CM | POA: Diagnosis not present

## 2019-09-13 DIAGNOSIS — M545 Low back pain: Secondary | ICD-10-CM | POA: Insufficient documentation

## 2019-09-13 DIAGNOSIS — N200 Calculus of kidney: Secondary | ICD-10-CM | POA: Diagnosis not present

## 2019-09-13 LAB — HEPATIC FUNCTION PANEL
ALT: 31 U/L (ref 0–44)
AST: 25 U/L (ref 15–41)
Albumin: 4.5 g/dL (ref 3.5–5.0)
Alkaline Phosphatase: 45 U/L (ref 38–126)
Bilirubin, Direct: <0.1 mg/dL (ref 0.0–0.2)
Total Bilirubin: 0.7 mg/dL (ref 0.3–1.2)
Total Protein: 7.1 g/dL (ref 6.5–8.1)

## 2019-09-13 LAB — CBC
HCT: 45.5 % (ref 39.0–52.0)
Hemoglobin: 15.6 g/dL (ref 13.0–17.0)
MCH: 31 pg (ref 26.0–34.0)
MCHC: 34.3 g/dL (ref 30.0–36.0)
MCV: 90.5 fL (ref 80.0–100.0)
Platelets: 235 10*3/uL (ref 150–400)
RBC: 5.03 MIL/uL (ref 4.22–5.81)
RDW: 12.1 % (ref 11.5–15.5)
WBC: 11.1 10*3/uL — ABNORMAL HIGH (ref 4.0–10.5)
nRBC: 0 % (ref 0.0–0.2)

## 2019-09-13 LAB — URINALYSIS, COMPLETE (UACMP) WITH MICROSCOPIC
Bacteria, UA: NONE SEEN
Bilirubin Urine: NEGATIVE
Glucose, UA: NEGATIVE mg/dL
Ketones, ur: NEGATIVE mg/dL
Leukocytes,Ua: NEGATIVE
Nitrite: NEGATIVE
Protein, ur: NEGATIVE mg/dL
Specific Gravity, Urine: 1.009 (ref 1.005–1.030)
Squamous Epithelial / LPF: NONE SEEN (ref 0–5)
pH: 6 (ref 5.0–8.0)

## 2019-09-13 LAB — LIPASE, BLOOD: Lipase: 37 U/L (ref 11–51)

## 2019-09-13 LAB — BASIC METABOLIC PANEL
Anion gap: 8 (ref 5–15)
BUN: 15 mg/dL (ref 6–20)
CO2: 25 mmol/L (ref 22–32)
Calcium: 9.3 mg/dL (ref 8.9–10.3)
Chloride: 102 mmol/L (ref 98–111)
Creatinine, Ser: 1.17 mg/dL (ref 0.61–1.24)
GFR calc Af Amer: >60 mL/min (ref >60)
GFR calc non Af Amer: >60 mL/min (ref >60)
Glucose, Bld: 161 mg/dL — ABNORMAL HIGH (ref 70–99)
Potassium: 4.3 mmol/L (ref 3.5–5.1)
Sodium: 135 mmol/L (ref 135–145)

## 2019-09-13 MED ORDER — OXYCODONE-ACETAMINOPHEN 5-325 MG PO TABS
1.0000 | ORAL_TABLET | ORAL | Status: AC
  Administered 2019-09-13: 17:00:00 1 via ORAL
  Filled 2019-09-13: qty 1

## 2019-09-13 MED ORDER — SODIUM CHLORIDE 0.9 % IV BOLUS
1000.0000 mL | Freq: Once | INTRAVENOUS | Status: AC
  Administered 2019-09-13: 17:00:00 1000 mL via INTRAVENOUS

## 2019-09-13 MED ORDER — OXYCODONE-ACETAMINOPHEN 5-325 MG PO TABS: 1.0000 | ORAL_TABLET | Freq: Four times a day (QID) | ORAL | 0 refills | Status: DC | PRN

## 2019-09-13 MED ORDER — ONDANSETRON 4 MG PO TBDP: 4.0000 mg | ORAL_TABLET | Freq: Four times a day (QID) | ORAL | 0 refills | Status: DC | PRN

## 2019-09-13 MED ORDER — MORPHINE SULFATE (PF) 4 MG/ML IV SOLN
4.0000 mg | Freq: Once | INTRAVENOUS | Status: AC
  Administered 2019-09-13: 4 mg via INTRAVENOUS
  Filled 2019-09-13: qty 1

## 2019-09-13 MED ORDER — ONDANSETRON HCL 4 MG/2ML IJ SOLN
4.0000 mg | Freq: Once | INTRAMUSCULAR | Status: DC
  Filled 2019-09-13: qty 2

## 2019-09-13 MED ORDER — TAMSULOSIN HCL 0.4 MG PO CAPS
0.4000 mg | ORAL_CAPSULE | Freq: Every day | ORAL | 0 refills | Status: DC
Start: 2019-09-13 — End: 2019-09-16

## 2019-09-13 MED ORDER — MORPHINE SULFATE (PF) 4 MG/ML IV SOLN
4.0000 mg | Freq: Once | INTRAVENOUS | Status: AC
  Administered 2019-09-13: 18:00:00 4 mg via INTRAVENOUS
  Filled 2019-09-13: qty 1

## 2019-09-13 NOTE — ED Triage Notes (Signed)
Pt to ED via POV for left flank pain. Pt states that the pain started after lunch. Pt states that yesterday he was having some pain in his LLQ. Pt also noted that his urine was very dark on Saturday but he was not having pain at the time. PT appears uncomfortable at this time. + hx/o kidney stones

## 2019-09-13 NOTE — ED Provider Notes (Signed)
[facility] Emergency Department Provider Note   ____________________________________________   First MD Initiated Contact with Patient 09/13/19 1631     (approximate)  I have reviewed the triage vital signs and the nursing notes.   HISTORY  Chief Complaint Flank Pain    HPI Philip Manning is a 59 y.o. male here for evaluation of left flank pain  Patient reports that over the weekend he noticed 1 day that he was having some slight discomfort in his left lower back and his urine seem very dark and a little foamy.  Then over the last day he has had some pretty significant low very low left back and left flank pain.  Is been associated with the slight improvement in the darkening of his urine, which is improved quite a bit actually after drinking fluids.  He urinated once this morning it looked pretty normal  His pain was about a 10 out of 10 with associated nausea but no vomiting due to the pain.  He reports it feels like it could be similar to kidney stones  Currently pain 7 out of 10, he had a small supply of oxycodone from previous neck surgery or neck issues and he took earlier today that brought the pain down.  No fevers or chills.  No Covid exposure.  No chest pain or shortness of breath.  Past Medical History:  Diagnosis Date  . Chronic kidney disease     There are no problems to display for this patient.   Past Surgical History:  Procedure Laterality Date  . APPENDECTOMY    . BACK SURGERY    . COLONOSCOPY WITH PROPOFOL N/A 05/03/2015   Procedure: COLONOSCOPY WITH PROPOFOL;  Surgeon: Manya Silvas, MD;  Location: [facility] ENDOSCOPY;  Service: Endoscopy;  Laterality: N/A;  . FRACTURE SURGERY    . TONSILLECTOMY    . VASECTOMY      Prior to Admission medications   Medication Sig Start Date End Date Taking? Authorizing Provider  omega-3 acid ethyl esters (LOVAZA) 1 G capsule Take 1 g by mouth daily.   Yes [provider]    terbinafine (LAMISIL) 250 MG tablet Take 250 mg by mouth daily. 08/25/19  Yes [provider]  vitamin C (ASCORBIC ACID) 500 MG tablet Take 500 mg by mouth daily.   Yes [provider]  ondansetron (ZOFRAN ODT) 4 MG disintegrating tablet Take 1 tablet (4 mg total) by mouth every 6 (six) hours as needed for nausea or vomiting. 09/13/19   Delman Kitten, MD  oxyCODONE-acetaminophen (PERCOCET/ROXICET) 5-325 MG tablet Take 1-2 tablets by mouth every 6 (six) hours as needed for severe pain. 09/13/19   Delman Kitten, MD  tamsulosin (FLOMAX) 0.4 MG CAPS capsule Take 1 capsule (0.4 mg total) by mouth daily. 09/13/19   Delman Kitten, MD    Allergies Patient has no known allergies.  No family history on file.  Social History Social History   Tobacco Use  . Smoking status: Never Smoker  . Smokeless tobacco: Never Used  Substance Use Topics  . Alcohol use: No  . Drug use: No    Review of Systems Constitutional: No fever/chills Eyes: No visual changes. ENT: No sore throat. Cardiovascular: Denies chest pain. Respiratory: Denies shortness of breath. Gastrointestinal: No abdominal pain.  Left flank.  Denies groin or testicular pain or swelling. Genitourinary: Negative for dysuria.  See HPI.  Urge to urinate. Musculoskeletal: Negative for back pain except in his left lower flank. Skin: Negative for  rash. Neurological: Negative for headaches, areas of focal weakness or numbness.    ____________________________________________   PHYSICAL EXAM:  VITAL SIGNS: ED Triage Vitals  Enc Vitals Group     BP 09/13/19 1539 (!) 185/108     Pulse Rate 09/13/19 1539 88     Resp 09/13/19 1539 19     Temp 09/13/19 1539 98 F (36.7 C)     Temp Source 09/13/19 1539 Oral     SpO2 09/13/19 1539 97 %     Weight 09/13/19 1616 200 lb (90.7 kg)     Height 09/13/19 1616 5\' 7"  (1.702 m)     Head Circumference --      Peak Flow --      Pain Score 09/13/19 1558 8     Pain Loc --      Pain Edu? --       Excl. in [place]? --     Constitutional: Alert and oriented. Well appearing and in no acute distress.  Very pleasant in no distress. Eyes: Conjunctivae are normal. Head: Atraumatic. Nose: No congestion/rhinnorhea. Mouth/Throat: Mucous membranes are moist. Neck: No stridor.  Cardiovascular: Normal rate, regular rhythm. Grossly normal heart sounds.  Good peripheral circulation. Respiratory: Normal respiratory effort.  No retractions. Lungs CTAB. Gastrointestinal: Soft and nontender.  Reports abdomen nontender.  No distention.  Left groin no pain swelling or rash.  No hernias.  Perhaps very mild left CVA tenderness, none on the right. Musculoskeletal: No lower extremity tenderness nor edema.  Very strong easily palpable dorsalis pedis and posterior tibial pulses bilateral. Neurologic:  Normal speech and language. No gross focal neurologic deficits are appreciated.  Skin:  Skin is warm, dry and intact. No rash noted. Psychiatric: Mood and affect are normal. Speech and behavior are normal.  ____________________________________________   LABS (all labs ordered are listed, but only abnormal results are displayed)  Labs Reviewed  URINALYSIS, COMPLETE (UACMP) WITH MICROSCOPIC - Abnormal; Notable for the following components:      Result Value   Color, Urine STRAW (*)    APPearance CLEAR (*)    Hgb urine dipstick MODERATE (*)    All other components within normal limits  CBC - Abnormal; Notable for the following components:   WBC 11.1 (*)    All other components within normal limits  BASIC METABOLIC PANEL - Abnormal; Notable for the following components:   Glucose, Bld 161 (*)    All other components within normal limits  HEPATIC FUNCTION PANEL  LIPASE, BLOOD   ____________________________________________  EKG   ____________________________________________  RADIOLOGY  CT Renal Stone Study  Result Date: 09/13/2019 CLINICAL DATA:  Left-sided flank pain EXAM: CT ABDOMEN AND PELVIS  WITHOUT CONTRAST TECHNIQUE: Multidetector CT imaging of the abdomen and pelvis was performed following the standard protocol without IV contrast. COMPARISON:  02/20/2007 CT FINDINGS: Lower chest: Lung bases demonstrate no acute consolidation or pleural effusion. Mild coronary calcification. Hepatobiliary: Heterogenous attenuation of the liver. No calcified gallstone or biliary dilatation Pancreas: Unremarkable. No pancreatic ductal dilatation or surrounding inflammatory changes. Spleen: Normal in size without focal abnormality. Adrenals/Urinary Tract: Adrenal glands are normal. Subcentimeter hyperdense lesion mid right kidney too small to further characterize. Mild left perinephric fat stranding. Intrarenal stones bilaterally measuring up to 5 mm lower pole right and 5 mm upper pole left. Mild left hydronephrosis, secondary to a 7 x 9 mm left UPJ stone. The bladder is lobulated in contour. Stomach/Bowel: The stomach is nonenlarged. No dilated small bowel. No colon wall thickening. Appendix  not well seen but no right lower quadrant inflammatory process. Vascular/Lymphatic: Mild aortic atherosclerosis. No aneurysm. No suspicious adenopathy. Reproductive: Prostate is unremarkable. Other: Negative for free air or free fluid Musculoskeletal: Posterior decompression changes T11 and T12. Degenerative changes. No acute osseous abnormality. IMPRESSION: 1. Mild left hydronephrosis, secondary to a 7 x 9 mm left UPJ stone. 2. Bilateral intrarenal calculi 3. Probable heterogenous fat infiltration of the liver. Electronically Signed   By: Donavan Foil M.D.   On: 09/13/2019 17:22    Imaging reviewed, left side hydronephrosis with 7 x 9 mm left UPJ stone ____________________________________________   PROCEDURES  Procedure(s) performed: None  Procedures  Critical Care performed: No  ____________________________________________   INITIAL IMPRESSION / ASSESSMENT AND PLAN / ED COURSE  Pertinent labs & imaging  results that were available during my care of the patient were reviewed by me and considered in my medical decision making (see chart for details).   Differential diagnosis includes but is not limited to, abdominal perforation, aortic dissection, cholecystitis, appendicitis, diverticulitis, colitis, esophagitis/gastritis, kidney stone, pyelonephritis, urinary tract infection, aortic aneurysm. All are considered in decision and treatment plan. Based upon the patient's presentation and risk factors, we will proceed with CT stone study as his clinical history seems to urologic in etiology suspicious for kidney stones.  Denies systemic symptoms such as fever to suggest infectious etiology.  Reassuring clinical abdominal exam.     Clinical Course as of Sep 12 1909  Mon Sep 13, 2019  1742 Updated patient, pain is once again returned.  Ordered IV morphine.  Discussed CT findings including large left-sided kidney stone with the patient.  Currently awaiting urinalysis.  Have paged urology also discussed obtaining plan of care.  Presently working to obtain better pain control.   [MQ]  [id number] Dr. Bernardo Heater advises pain control as goal and Urology can call tomorrow for an appointment this week.    [MQ]  [id number] Patient reports pain seemed like improved briefly but now is returned located again in the same area moderate to severe nature.  Is fully awake and alert, additional morphine ordered.   [MQ]  1857 Patient resting comfortably now, reports his pain is much better.  He appears much improved.   [MQ]    Clinical Course User Index [MQ] Delman Kitten, MD    ----------------------------------------- 7:11 PM on 09/13/2019 -----------------------------------------   I will prescribe the patient a narcotic pain medicine due to their condition which I anticipate will cause at least moderate pain short term. I discussed with the patient safe use of narcotic pain medicines, and that they are not to drive, work in  dangerous areas, or ever take more than prescribed (no more than 1 pill every 6 hours). We discussed that this is the type of medication that can be  overdosed on and the risks of this type of medicine. Patient is very agreeable to only use as prescribed and to never use more than prescribed.  Patient fully awake and alert.  Pain well controlled.  Plan in place urology will call patient tomorrow for scheduling.  Dr. Bernardo Heater will follow up.  Return precautions and treatment recommendations and follow-up discussed with the patient who is agreeable with the plan.  Patient's wife driving him home. ____________________________________________   FINAL CLINICAL IMPRESSION(S) / ED DIAGNOSES  Final diagnoses:  Kidney stone on left side        Note:  This document was prepared using Dragon voice recognition software and may include unintentional dictation errors  Delman Kitten, MD 09/13/19 1911

## 2019-09-13 NOTE — ED Notes (Addendum)
Pt states "trying to urinate for the last 3 hours but is unable to".

## 2019-09-14 ENCOUNTER — Ambulatory Visit: Payer: 59 | Admitting: Urology

## 2019-09-14 ENCOUNTER — Ambulatory Visit
Admission: RE | Admit: 2019-09-14 | Discharge: 2019-09-14 | Disposition: A | Discharge status: Home / Self Care | Payer: 59 | Source: Ambulatory Visit | Attending: Urology | Admitting: Urology

## 2019-09-14 ENCOUNTER — Ambulatory Visit
Admission: RE | Admit: 2019-09-14 | Discharge: 2019-09-14 | Disposition: A | Discharge status: Home / Self Care | Payer: 59 | Attending: Urology | Admitting: Urology

## 2019-09-14 ENCOUNTER — Other Ambulatory Visit
Admission: RE | Admit: 2019-09-14 | Discharge: 2019-09-14 | Disposition: A | Discharge status: Home / Self Care | Payer: 59 | Source: Ambulatory Visit | Attending: Urology | Admitting: Urology

## 2019-09-14 ENCOUNTER — Other Ambulatory Visit: Payer: Self-pay | Admitting: Urology

## 2019-09-14 ENCOUNTER — Encounter: Payer: Self-pay | Admitting: Urology

## 2019-09-14 VITALS — BP 136/80 | HR 80 | Ht 67.0 in | Wt 200.0 lb

## 2019-09-14 DIAGNOSIS — N201 Calculus of ureter: Secondary | ICD-10-CM

## 2019-09-14 DIAGNOSIS — N202 Calculus of kidney with calculus of ureter: Secondary | ICD-10-CM | POA: Insufficient documentation

## 2019-09-14 DIAGNOSIS — Z20822 Contact with and (suspected) exposure to covid-19: Secondary | ICD-10-CM | POA: Diagnosis not present

## 2019-09-14 DIAGNOSIS — Z01818 Encounter for other preprocedural examination: Secondary | ICD-10-CM | POA: Diagnosis present

## 2019-09-14 NOTE — Patient Instructions (Signed)
Lithotripsy  Lithotripsy is a treatment that can sometimes help eliminate kidney stones and the pain that they cause. A form of lithotripsy, also known as extracorporeal shock wave lithotripsy, is a nonsurgical procedure that crushes a kidney stone with shock waves. These shock waves pass through your body and focus on the kidney stone. They cause the kidney stone to break up while it is still in the urinary tract. This makes it easier for the smaller pieces of stone to pass in the urine. Tell a health care provider about:  Any allergies you have.  All medicines you are taking, including vitamins, herbs, eye drops, creams, and over-the-counter medicines.  Any blood disorders you have.  Any surgeries you have had.  Any medical conditions you have.  Whether you are pregnant or may be pregnant.  Any problems you or family members have had with anesthetic medicines. What are the risks? Generally, this is a safe procedure. However, problems may occur, including:  Infection.  Bleeding of the kidney.  Bruising of the kidney or skin.  Scarring of the kidney, which can lead to: ? Increased blood pressure. ? Poor kidney function. ? Return (recurrence) of kidney stones.  Damage to other structures or organs, such as the liver, colon, spleen, or pancreas.  Blockage (obstruction) of the the tube that carries urine from the kidney to the bladder (ureter).  Failure of the kidney stone to break into pieces (fragments). What happens before the procedure? Staying hydrated Follow instructions from your health care provider about hydration, which may include:  Up to 2 hours before the procedure - you may continue to drink clear liquids, such as water, clear fruit juice, black coffee, and plain tea. Eating and drinking restrictions Follow instructions from your health care provider about eating and drinking, which may include:  8 hours before the procedure - stop eating heavy meals or foods  such as meat, fried foods, or fatty foods.  6 hours before the procedure - stop eating light meals or foods, such as toast or cereal.  6 hours before the procedure - stop drinking milk or drinks that contain milk.  2 hours before the procedure - stop drinking clear liquids. General instructions  Plan to have someone take you home from the hospital or clinic.  Ask your health care provider about: ? Changing or stopping your regular medicines. This is especially important if you are taking diabetes medicines or blood thinners. ? Taking medicines such as aspirin and ibuprofen. These medicines and other NSAIDs can thin your blood. Do not take these medicines for 7 days before your procedure if your health care provider instructs you not to.  You may have tests, such as: ? Blood tests. ? Urine tests. ? Imaging tests, such as a CT scan. What happens during the procedure?  To lower your risk of infection: ? Your health care team will wash or sanitize their hands. ? Your skin will be washed with soap.  An IV tube will be inserted into one of your veins. This tube will give you fluids and medicines.  You will be given one or more of the following: ? A medicine to help you relax (sedative). ? A medicine to make you fall asleep (general anesthetic).  A water-filled cushion may be placed behind your kidney or on your abdomen. In some cases you may be placed in a tub of lukewarm water.  Your body will be positioned in a way that makes it easy to target the kidney   stone.  A flexible tube with holes in it (stent) may be placed in the ureter. This will help keep urine flowing from the kidney if the fragments of the stone have been blocking the ureter.  An X-ray or ultrasound exam will be done to locate your stone.  Shock waves will be aimed at the stone. If you are awake, you may feel a tapping sensation as the shock waves pass through your body. The procedure may vary among health care  providers and hospitals. What happens after the procedure?  You may have an X-ray to see whether the procedure was able to break up the kidney stone and how much of the stone has passed. If large stone fragments remain after treatment, you may need to have a second procedure at a later time.  Your blood pressure, heart rate, breathing rate, and blood oxygen level will be monitored until the medicines you were given have worn off.  You may be given antibiotics or pain medicine as needed.  If a stent was placed in your ureter during surgery, it may stay in place for a few weeks.  You may need strain your urine to collect pieces of the kidney stone for testing.  You will need to drink plenty of water.  Do not drive for 24 hours if you were given a sedative. Summary  Lithotripsy is a treatment that can sometimes help eliminate kidney stones and the pain that they cause.  A form of lithotripsy, also known as extracorporeal shock wave lithotripsy, is a nonsurgical procedure that crushes a kidney stone with shock waves.  Generally, this is a safe procedure. However, problems may occur, including damage to the kidney or other organs, infection, or obstruction of the tube that carries urine from the kidney to the bladder (ureter).  When you go home, you will need to drink plenty of water. You may be asked to strain your urine to collect pieces of the kidney stone for testing. This information is not intended to replace advice given to you by your health care provider. Make sure you discuss any questions you have with your health care provider. Document Revised: 08/10/2018 Document Reviewed: 03/20/2016 Elsevier Patient Education  2020 [place] After This sheet gives you information about how to care for yourself after your procedure. Your health care provider may also give you more specific instructions. If you have problems or questions, contact your health care  provider. What can I expect after the procedure? After the procedure, it is common to have:  Some blood in your urine. This should only last for a few days.  Soreness in your back, sides, or upper abdomen for a few days.  Blotches or bruises on your back where the pressure wave entered the skin.  Pain, discomfort, or nausea when pieces (fragments) of the kidney stone move through the tube that carries urine from the kidney to the bladder (ureter). Stone fragments may pass soon after the procedure, but they may continue to pass for up to 4-8 weeks. ? If you have severe pain or nausea, contact your health care provider. This may be caused by a large stone that was not broken up, and this may mean that you need more treatment.  Some pain or discomfort during urination.  Some pain or discomfort in the lower abdomen or (in men) at the base of the penis. Follow these instructions at home: Medicines  Take over-the-counter and prescription medicines only as told by  your health care provider.  If you were prescribed an antibiotic medicine, take it as told by your health care provider. Do not stop taking the antibiotic even if you start to feel better.  Do not drive for 24 hours if you were given a medicine to help you relax (sedative).  Do not drive or use heavy machinery while taking prescription pain medicine. Eating and drinking      Drink enough water and fluids to keep your urine clear or pale yellow. This helps any remaining pieces of the stone to pass. It can also help prevent new stones from forming.  Eat plenty of fresh fruits and vegetables.  Follow instructions from your health care provider about eating and drinking restrictions. You may be instructed: ? To reduce how much salt (sodium) you eat or drink. Check ingredients and nutrition facts on packaged foods and beverages. ? To reduce how much meat you eat.  Eat the recommended amount of calcium for your age and gender. Ask  your health care provider how much calcium you should have. General instructions  Get plenty of rest.  Most people can resume normal activities 1-2 days after the procedure. Ask your health care provider what activities are safe for you.  Your health care provider may direct you to lie in a certain position (postural drainage) and tap firmly (percuss) over your kidney area to help stone fragments pass. Follow instructions as told by your health care provider.  If directed, strain all urine through the strainer that was provided by your health care provider. ? Keep all fragments for your health care provider to see. Any stones that are found may be sent to a medical lab for examination. The stone may be as small as a grain of salt.  Keep all follow-up visits as told by your health care provider. This is important. Contact a health care provider if:  You have pain that is severe or does not get better with medicine.  You have nausea that is severe or does not go away.  You have blood in your urine longer than your health care provider told you to expect.  You have more blood in your urine.  You have pain during urination that does not go away.  You urinate more frequently than usual and this does not go away.  You develop a rash or any other possible signs of an allergic reaction. Get help right away if:  You have severe pain in your back, sides, or upper abdomen.  You have severe pain while urinating.  Your urine is very dark red.  You have blood in your stool (feces).  You cannot pass any urine at all.  You feel a strong urge to urinate after emptying your bladder.  You have a fever or chills.  You develop shortness of breath, difficulty breathing, or chest pain.  You have severe nausea that leads to persistent vomiting.  You faint. Summary  After this procedure, it is common to have some pain, discomfort, or nausea when pieces (fragments) of the kidney stone move  through the tube that carries urine from the kidney to the bladder (ureter). If this pain or nausea is severe, however, you should contact your health care provider.  Most people can resume normal activities 1-2 days after the procedure. Ask your health care provider what activities are safe for you.  Drink enough water and fluids to keep your urine clear or pale yellow. This helps any remaining pieces of the  stone to pass, and it can help prevent new stones from forming.  If directed, strain your urine and keep all fragments for your health care provider to see. Fragments or stones may be as small as a grain of salt.  Get help right away if you have severe pain in your back, sides, or upper abdomen or have severe pain while urinating. This information is not intended to replace advice given to you by your health care provider. Make sure you discuss any questions you have with your health care provider. Document Revised: 08/10/2018 Document Reviewed: 03/20/2016 Elsevier Patient Education  2020 [place].   Dietary Guidelines to Help Prevent Kidney Stones Kidney stones are deposits of minerals and salts that form inside your kidneys. Your risk of developing kidney stones may be greater depending on your diet, your lifestyle, the medicines you take, and whether you have certain medical conditions. Most people can reduce their chances of developing kidney stones by following the instructions below. Depending on your overall health and the type of kidney stones you tend to develop, your dietitian may give you more specific instructions. What are tips for following this plan? Reading food labels  Choose foods with "no salt added" or "low-salt" labels. Limit your sodium intake to less than 1500 mg per day.  Choose foods with calcium for each meal and snack. Try to eat about 300 mg of calcium at each meal. Foods that contain 200-500 mg of calcium per serving include: ? 8 oz (237 ml) of milk,  fortified nondairy milk, and fortified fruit juice. ? 8 oz (237 ml) of kefir, yogurt, and soy yogurt. ? 4 oz (118 ml) of tofu. ? 1 oz of cheese. ? 1 cup (300 g) of dried figs. ? 1 cup (91 g) of cooked broccoli. ? 1-3 oz can of sardines or mackerel.  Most people need 1000 to 1500 mg of calcium each day. Talk to your dietitian about how much calcium is recommended for you. Shopping  Buy plenty of fresh fruits and vegetables. Most people do not need to avoid fruits and vegetables, even if they contain nutrients that may contribute to kidney stones.  When shopping for convenience foods, choose: ? Whole pieces of fruit. ? Premade salads with dressing on the side. ? Low-fat fruit and yogurt smoothies.  Avoid buying frozen meals or prepared deli foods.  Look for foods with live cultures, such as yogurt and kefir. Cooking  Do not add salt to food when cooking. Place a salt shaker on the table and allow each person to add his or her own salt to taste.  Use vegetable protein, such as beans, textured vegetable protein (TVP), or tofu instead of meat in pasta, casseroles, and soups. Meal planning   Eat less salt, if told by your dietitian. To do this: ? Avoid eating processed or premade food. ? Avoid eating fast food.  Eat less animal protein, including cheese, meat, poultry, or fish, if told by your dietitian. To do this: ? Limit the number of times you have meat, poultry, fish, or cheese each week. Eat a diet free of meat at least 2 days a week. ? Eat only one serving each day of meat, poultry, fish, or seafood. ? When you prepare animal protein, cut pieces into small portion sizes. For most meat and fish, one serving is about the size of one deck of cards.  Eat at least 5 servings of fresh fruits and vegetables each day. To do this: ? Keep  fruits and vegetables on hand for snacks. ? Eat 1 piece of fruit or a handful of berries with breakfast. ? Have a salad and fruit at lunch. ? Have  two kinds of vegetables at dinner.  Limit foods that are high in a substance called oxalate. These include: ? Spinach. ? Rhubarb. ? Beets. ? Potato chips and french fries. ? Nuts.  If you regularly take a diuretic medicine, make sure to eat at least 1-2 fruits or vegetables high in potassium each day. These include: ? Avocado. ? Banana. ? Orange, prune, carrot, or tomato juice. ? Baked potato. ? Cabbage. ? Beans and split peas. General instructions   Drink enough fluid to keep your urine clear or pale yellow. This is the most important thing you can do.  Talk to your health care provider and dietitian about taking daily supplements. Depending on your health and the cause of your kidney stones, you may be advised: ? Not to take supplements with vitamin C. ? To take a calcium supplement. ? To take a daily probiotic supplement. ? To take other supplements such as magnesium, fish oil, or vitamin B6.  Take all medicines and supplements as told by your health care provider.  Limit alcohol intake to no more than 1 drink a day for nonpregnant women and 2 drinks a day for men. One drink equals 12 oz of beer, 5 oz of wine, or 1 oz of hard liquor.  Lose weight if told by your health care provider. Work with your dietitian to find strategies and an eating plan that works best for you. What foods are not recommended? Limit your intake of the following foods, or as told by your dietitian. Talk to your dietitian about specific foods you should avoid based on the type of kidney stones and your overall health. Grains Breads. Bagels. Rolls. Baked goods. Salted crackers. Cereal. Pasta. Vegetables Spinach. Rhubarb. Beets. Canned vegetables. [name]. Olives. Meats and other protein foods Nuts. Nut butters. Large portions of meat, poultry, or fish. Salted or cured meats. Deli meats. Hot dogs. Sausages. Dairy Cheese. Beverages Regular soft drinks. Regular vegetable juice. Seasonings and other  foods Seasoning blends with salt. Salad dressings. Canned soups. Soy sauce. Ketchup. Barbecue sauce. Canned pasta sauce. Casseroles. Pizza. Lasagna. Frozen meals. Potato chips. [place] fries. Summary  You can reduce your risk of kidney stones by making changes to your diet.  The most important thing you can do is drink enough fluid. You should drink enough fluid to keep your urine clear or pale yellow.  Ask your health care provider or dietitian how much protein from animal sources you should eat each day, and also how much salt and calcium you should have each day. This information is not intended to replace advice given to you by your health care provider. Make sure you discuss any questions you have with your health care provider. Document Revised: 08/19/2018 Document Reviewed: 04/09/2016 Elsevier Patient Education  2020 [employer].

## 2019-09-14 NOTE — Progress Notes (Signed)
09/14/19 1:10 PM   Philip Manning 1961/02/02 [id number]  CC: Left flank pain  HPI: I saw Philip Manning as an add-on in urology clinic today for evaluation of a left proximal ureteral stone.  He was seen in the ED yesterday with severe left-sided flank pain, and a CT showed a 9 mm left UPJ stone with mild hydronephrosis and with some smaller intrarenal calculi bilaterally.  Urinalysis was non-infected, labs were benign, and he was discharged with close urology follow-up.  He reports he has had intermittent pain on the left side over the last 48 hours.  After being seen in the ED yesterday, his pain is much better controlled with narcotics.  He denies any fevers or chills.  He has not been taking any NSAIDs.  He reports an extensive history of stone disease with 15-20 prior stones, including successful shockwave lithotripsy previously.  He has never undergone a metabolic evaluation that he is aware of.  In the ED, urinalysis and lab work were benign, CT showed a 9 mm left UPJ stone, 14cm SSD, 900HU.  Stone clearly seen on KUB.  PMH: Past Medical History:  Diagnosis Date  . Chronic kidney disease     Surgical History: Past Surgical History:  Procedure Laterality Date  . APPENDECTOMY    . BACK SURGERY    . COLONOSCOPY WITH PROPOFOL N/A 05/03/2015   Procedure: COLONOSCOPY WITH PROPOFOL;  Surgeon: Manya Silvas, MD;  Location: [facility] ENDOSCOPY;  Service: Endoscopy;  Laterality: N/A;  . FRACTURE SURGERY    . TONSILLECTOMY    . VASECTOMY      Social History:  reports that he has never smoked. He has never used smokeless tobacco. He reports that he does not drink alcohol or use drugs.  Physical Exam: BP 136/80 (BP Location: Left Arm, Patient Position: Sitting, Cuff Size: Normal)   Pulse 80   Ht 5\' 7"  (D34-534 m)   Wt 200 lb (90.7 kg)   BMI 31.32 kg/m    Constitutional:  Alert and oriented, No acute distress. Cardiovascular: No clubbing, cyanosis, or edema. Respiratory: Normal  respiratory effort, no increased work of breathing. GI: Abdomen is soft, nontender, nondistended, no abdominal masses GU: Left CVA tenderness  Laboratory Data: Reviewed, see HPI  Pertinent Imaging: I have personally reviewed the CT and KUB. 57mm left UPJ stone with mild hydro, 14cm SSD, 900HU, clearly seen on KUB. 3 small left renal stones, one small right renal stone  Assessment & Plan:   In summary, is a 59 year old healthy male with 48 hours of left-sided flank pain secondary to a 9 mm left UPJ stone with mild hydronephrosis.  There are no clinical or laboratory signs of infection.  We discussed various treatment options for urolithiasis including observation with or without medical expulsive therapy, shockwave lithotripsy (SWL), ureteroscopy and laser lithotripsy with stent placement, and percutaneous nephrolithotomy.  We discussed that management is based on stone size, location, density, patient co-morbidities, and patient preference.   Stones <28mm in size have a >80% spontaneous passage rate. Data surrounding the use of tamsulosin for medical expulsive therapy is controversial, but meta analyses suggests it is most efficacious for distal stones between 5-22mm in size. Possible side effects include dizziness/lightheadedness, and retrograde ejaculation.  SWL has a lower stone free rate in a single procedure, but also a lower complication rate compared to ureteroscopy and avoids a stent and associated stent related symptoms. Possible complications include renal hematoma, steinstrasse, and need for additional treatment.  Ureteroscopy with laser  lithotripsy and stent placement has a higher stone free rate than SWL in a single procedure, however increased complication rate including possible infection, ureteral injury, bleeding, and stent related morbidity. Common stent related symptoms include dysuria, urgency/frequency, and flank pain.  After an extensive discussion of the risks and benefits  of the above treatment options, the patient would like to proceed with left shockwave lithotripsy this week.  Philip Madrid, MD 09/14/2019  [facility] [address], [place], [place] [address] [phone]

## 2019-09-15 ENCOUNTER — Other Ambulatory Visit: Payer: Self-pay | Admitting: Urology

## 2019-09-15 DIAGNOSIS — N135 Crossing vessel and stricture of ureter without hydronephrosis: Secondary | ICD-10-CM

## 2019-09-15 LAB — SARS CORONAVIRUS 2 (TAT 6-24 HRS): SARS Coronavirus 2: NEGATIVE

## 2019-09-16 ENCOUNTER — Encounter: Payer: Self-pay | Admitting: Urology

## 2019-09-16 ENCOUNTER — Telehealth: Payer: Self-pay | Admitting: Radiology

## 2019-09-16 ENCOUNTER — Ambulatory Visit
Admission: RE | Admit: 2019-09-16 | Discharge: 2019-09-16 | Disposition: A | Discharge status: Home / Self Care | Payer: 59 | Source: Ambulatory Visit | Attending: Urology | Admitting: Urology

## 2019-09-16 ENCOUNTER — Other Ambulatory Visit: Payer: Self-pay

## 2019-09-16 ENCOUNTER — Ambulatory Visit: Payer: 59

## 2019-09-16 ENCOUNTER — Encounter
Admission: RE | Disposition: A | Discharge status: Home / Self Care | Payer: Self-pay | Source: Ambulatory Visit | Attending: Urology

## 2019-09-16 DIAGNOSIS — N201 Calculus of ureter: Secondary | ICD-10-CM | POA: Diagnosis present

## 2019-09-16 DIAGNOSIS — N135 Crossing vessel and stricture of ureter without hydronephrosis: Secondary | ICD-10-CM

## 2019-09-16 HISTORY — PX: EXTRACORPOREAL SHOCK WAVE LITHOTRIPSY: SHX1557

## 2019-09-16 SURGERY — LITHOTRIPSY, ESWL
Anesthesia: Moderate Sedation | Laterality: Left

## 2019-09-16 MED ORDER — OXYCODONE-ACETAMINOPHEN 5-325 MG PO TABS: 1.0000 | ORAL_TABLET | ORAL | 0 refills | Status: AC | PRN

## 2019-09-16 MED ORDER — DIPHENHYDRAMINE HCL 25 MG PO CAPS
ORAL_CAPSULE | ORAL | Status: AC
  Filled 2019-09-16: qty 1

## 2019-09-16 MED ORDER — ONDANSETRON HCL 4 MG/2ML IJ SOLN
INTRAMUSCULAR | Status: AC
  Filled 2019-09-16: qty 2

## 2019-09-16 MED ORDER — TAMSULOSIN HCL 0.4 MG PO CAPS
0.4000 mg | ORAL_CAPSULE | Freq: Every day | ORAL | 0 refills | Status: DC
Start: 2019-09-16 — End: 2019-09-30

## 2019-09-16 MED ORDER — CIPROFLOXACIN HCL 500 MG PO TABS
500.0000 mg | ORAL_TABLET | ORAL | Status: AC
  Administered 2019-09-16: 500 mg via ORAL

## 2019-09-16 MED ORDER — SENNOSIDES-DOCUSATE SODIUM 8.6-50 MG PO TABS
1.0000 | ORAL_TABLET | Freq: Two times a day (BID) | ORAL | 0 refills | Status: DC
Start: 2019-09-16 — End: 2022-10-25

## 2019-09-16 MED ORDER — DIAZEPAM 5 MG PO TABS
ORAL_TABLET | ORAL | Status: AC
  Filled 2019-09-16: qty 2

## 2019-09-16 MED ORDER — CIPROFLOXACIN HCL 500 MG PO TABS
ORAL_TABLET | ORAL | Status: AC
  Filled 2019-09-16: qty 1

## 2019-09-16 MED ORDER — SODIUM CHLORIDE 0.9 % IV SOLN: INTRAVENOUS | Status: DC

## 2019-09-16 MED ORDER — KETOROLAC TROMETHAMINE 15 MG/ML IJ SOLN
INTRAMUSCULAR | Status: AC
  Administered 2019-09-16: 15 mg via INTRAVENOUS
  Filled 2019-09-16: qty 1

## 2019-09-16 MED ORDER — DIAZEPAM 5 MG PO TABS
10.0000 mg | ORAL_TABLET | ORAL | Status: AC
  Administered 2019-09-16: 10 mg via ORAL

## 2019-09-16 MED ORDER — ONDANSETRON HCL 4 MG/2ML IJ SOLN
4.0000 mg | Freq: Once | INTRAMUSCULAR | Status: AC
  Administered 2019-09-16: 4 mg via INTRAVENOUS

## 2019-09-16 MED ORDER — KETOROLAC TROMETHAMINE 15 MG/ML IJ SOLN: 15.0000 mg | Freq: Once | INTRAMUSCULAR | Status: AC

## 2019-09-16 MED ORDER — DIPHENHYDRAMINE HCL 25 MG PO CAPS
25.0000 mg | ORAL_CAPSULE | ORAL | Status: AC
  Administered 2019-09-16: 15:00:00 25 mg via ORAL

## 2019-09-16 NOTE — Telephone Encounter (Signed)
Patient reports severe pain, abdominal distention, and requests to move shockwave lithotripsy up. Last dose of pain medication was yesterday because patient is constipated. Reports taking 4 dulcolax yesterday. Advised patient to take pain medication and 2 dulcolax tablets. Advised patient that if he hasn't had a bowel movement by tomorrow to drink a bottle of magnesium citrate. If still no bowel movement call the office. Questions answered. Patient expresses understanding.

## 2019-09-16 NOTE — Discharge Instructions (Signed)
AMBULATORY SURGERY  °DISCHARGE INSTRUCTIONS ° ° °1) The drugs that you were given will stay in your system until tomorrow so for the next 24 hours you should not: ° °A) Drive an automobile °B) Make any legal decisions °C) Drink any alcoholic beverage ° ° °2) You may resume regular meals tomorrow.  Today it is better to start with liquids and gradually work up to solid foods. ° °You may eat anything you prefer, but it is better to start with liquids, then soup and crackers, and gradually work up to solid foods. ° ° °3) Please notify your doctor immediately if you have any unusual bleeding, trouble breathing, redness and pain at the surgery site, drainage, fever, or pain not relieved by medication. ° ° ° °4) Additional Instructions: ° ° ° ° ° ° ° °Please contact your physician with any problems or Same Day Surgery at [phone], Monday through Friday 6 am to 4 pm, or [place] [facility] [place] Main number at [phone]. °

## 2019-09-16 NOTE — Brief Op Note (Signed)
09/16/2019  5:38 PM  PATIENT:  Philip Manning  59 y.o. male  PRE-OPERATIVE DIAGNOSIS:  Left 64mm proximal ureteral stone  POST-OPERATIVE DIAGNOSIS:  Same  PROCEDURE:  Procedure(s): EXTRACORPOREAL SHOCK WAVE LITHOTRIPSY (ESWL) (Left)  SURGEON:  Surgeon(s) and Role:    * Billey Co, MD - Primary  ANESTHESIA: Conscious Sedation  EBL:  None  Drains: None  Specimen: None  Findings:  1.Stone appeared to smudge on KUB at conclusion of SWL  DISPO: Flomax, pain meds PRN, RTC 2 weeks KUB Severe constipation pre-op secondary to narcotics, recommended aggressive bowel regimen  Nickolas Madrid, MD 09/16/2019

## 2019-09-17 ENCOUNTER — Emergency Department
Admission: EM | Admit: 2019-09-17 | Discharge: 2019-09-17 | Disposition: A | Discharge status: Home / Self Care | Payer: 59 | Attending: Emergency Medicine | Admitting: Emergency Medicine

## 2019-09-17 ENCOUNTER — Encounter: Payer: Self-pay | Admitting: Emergency Medicine

## 2019-09-17 DIAGNOSIS — Z5321 Procedure and treatment not carried out due to patient leaving prior to being seen by health care provider: Secondary | ICD-10-CM | POA: Insufficient documentation

## 2019-09-17 DIAGNOSIS — Z9889 Other specified postprocedural states: Secondary | ICD-10-CM | POA: Insufficient documentation

## 2019-09-17 DIAGNOSIS — K5900 Constipation, unspecified: Secondary | ICD-10-CM | POA: Diagnosis not present

## 2019-09-17 NOTE — ED Notes (Signed)
Per mary RN pt had large bowel movement after triage and decided to leave, was feeling better.

## 2019-09-17 NOTE — ED Triage Notes (Signed)
Pt from home with constipation. Pt states his last BM was Saturday or Sunday and that he is very uncomfortable. He reports that he had lithrotripsy yesterday. He states that he has had mag citrate today, as well as dulcolax, miralax and an enema. Pt alert & oriented, nad noted.

## 2019-09-20 ENCOUNTER — Telehealth: Payer: Self-pay | Admitting: *Deleted

## 2019-09-20 NOTE — Telephone Encounter (Signed)
Patient called in this morning and states he has not had a bowel movement since Thursday . Advised him to take Miralax and take a stool softer. He has been urinating .

## 2019-09-30 ENCOUNTER — Other Ambulatory Visit: Payer: Self-pay

## 2019-09-30 ENCOUNTER — Encounter: Payer: Self-pay | Admitting: Urology

## 2019-09-30 ENCOUNTER — Other Ambulatory Visit: Payer: Self-pay | Admitting: Family Medicine

## 2019-09-30 ENCOUNTER — Ambulatory Visit
Admission: RE | Admit: 2019-09-30 | Discharge: 2019-09-30 | Disposition: A | Discharge status: Home / Self Care | Payer: 59 | Source: Ambulatory Visit | Attending: Urology | Admitting: Urology

## 2019-09-30 ENCOUNTER — Ambulatory Visit (INDEPENDENT_AMBULATORY_CARE_PROVIDER_SITE_OTHER): Payer: 59 | Admitting: Urology

## 2019-09-30 VITALS — BP 159/96 | HR 99 | Ht 67.0 in | Wt 200.0 lb

## 2019-09-30 DIAGNOSIS — N201 Calculus of ureter: Secondary | ICD-10-CM | POA: Diagnosis not present

## 2019-09-30 DIAGNOSIS — N135 Crossing vessel and stricture of ureter without hydronephrosis: Secondary | ICD-10-CM

## 2019-09-30 MED ORDER — TAMSULOSIN HCL 0.4 MG PO CAPS: 0.4000 mg | ORAL_CAPSULE | Freq: Every day | ORAL | 1 refills | Status: DC

## 2019-09-30 NOTE — Progress Notes (Signed)
09/30/2019 12:38 PM   Philip Manning 30-Sep-1960 [id number]  Referring provider: Maryland Pink, MD [address] [facility] [place],  [place] [address]  Chief Complaint  Patient presents with  . Nephrolithiasis    HPI: Philip Manning is a 59 year old male with nephrolithiasis who is status post ESWL for a left 9 mm UPJ stone.    He underwent ESWL with Dr. Diamantina Providence on 09/16/2019 and the stone appeared to smudge on KUB at the conclusion of SWL.    Today, he states he has not passed any fragments since the procedure.  He states he continues to have left-sided flank pain.  He describes the pain as someone pounding a fist in his flank area.  He has been having associated difficulty urinating, weak stream and gross hematuria.   He was having issues with constipation while taking the Percocet, so he has limited his Percocet use and is taking stool softeners and laxatives.  He states that his constipation is resolved at this time.  Patient denies any modifying or aggravating factors.  Patient denies any dysuria.  Patient denies any fevers, chills, nausea or vomiting.  KUB 09/30/2019 Bilateral nephrolithiasis again noted.  What appears to be prominent stone debris noted throughout the distal left ureter.   PMH: Past Medical History:  Diagnosis Date  . Chronic kidney disease     Surgical History: Past Surgical History:  Procedure Laterality Date  . APPENDECTOMY    . BACK SURGERY    . COLONOSCOPY WITH PROPOFOL N/A 05/03/2015   Procedure: COLONOSCOPY WITH PROPOFOL;  Surgeon: Manya Silvas, MD;  Location: [facility] ENDOSCOPY;  Service: Endoscopy;  Laterality: N/A;  . EXTRACORPOREAL SHOCK WAVE LITHOTRIPSY Left 09/16/2019   Procedure: EXTRACORPOREAL SHOCK WAVE LITHOTRIPSY (ESWL);  Surgeon: Billey Co, MD;  Location: [facility] ORS;  Service: Urology;  Laterality: Left;  . FRACTURE SURGERY    . TONSILLECTOMY    . VASECTOMY      Home Medications:  Allergies as of 09/30/2019   No  Known Allergies     Medication List       Accurate as of Sep 30, 2019 12:38 PM. If you have any questions, ask your nurse or doctor.        aspirin EC 81 MG tablet Take 81 mg by mouth daily.   bisacodyl 5 MG EC tablet Commonly known as: DULCOLAX Take 5 mg by mouth daily as needed for moderate constipation.   omega-3 acid ethyl esters 1 g capsule Commonly known as: LOVAZA Take 1 g by mouth daily.   ondansetron 4 MG disintegrating tablet Commonly known as: Zofran ODT Take 1 tablet (4 mg total) by mouth every 6 (six) hours as needed for nausea or vomiting.   oxyCODONE-acetaminophen 5-325 MG tablet Commonly known as: PERCOCET/ROXICET Take 1-2 tablets by mouth every 6 (six) hours as needed for severe pain.   polyethylene glycol 17 g packet Commonly known as: MIRALAX / GLYCOLAX Take 17 g by mouth daily.   senna-docusate 8.6-50 MG tablet Commonly known as: Senokot-S Take 1 tablet by mouth 2 (two) times daily.   tamsulosin 0.4 MG Caps capsule Commonly known as: FLOMAX Take 1 capsule (0.4 mg total) by mouth daily.   terbinafine 250 MG tablet Commonly known as: LAMISIL Take 250 mg by mouth daily.   vitamin C 500 MG tablet Commonly known as: ASCORBIC ACID Take 500 mg by mouth daily.       Allergies: No Known Allergies  Family History:  History reviewed. No pertinent family history.  Social History:  reports that he has never smoked. He has never used smokeless tobacco. He reports that he does not drink alcohol or use drugs.  ROS: Pertinent ROS in HPI  Physical Exam: BP (!) 159/96   Pulse 99   Ht 5\' 7"  (1.702 m)   Wt 200 lb (90.7 kg)   BMI 31.32 kg/m   Constitutional:  Well nourished. Alert and oriented, No acute distress. HEENT: [place] AT, mask in place.  Trachea midline Cardiovascular: No clubbing, cyanosis, or edema. Respiratory: Normal respiratory effort, no increased work of breathing. Neurologic: Grossly intact, no focal deficits, moving all 4  extremities. Psychiatric: Normal mood and affect.  Laboratory Data: Lab Results  Component Value Date   WBC 11.1 (H) 09/13/2019   HGB 15.6 09/13/2019   HCT 45.5 09/13/2019   MCV 90.5 09/13/2019   PLT 235 09/13/2019    Lab Results  Component Value Date   CREATININE 1.17 09/13/2019    Lab Results  Component Value Date   AST 25 09/13/2019   Lab Results  Component Value Date   ALT 31 09/13/2019    Urinalysis    Component Value Date/Time   COLORURINE STRAW (A) 09/13/2019 1604   APPEARANCEUR CLEAR (A) 09/13/2019 1604   LABSPEC 1.009 09/13/2019 1604   PHURINE 6.0 09/13/2019 1604   GLUCOSEU NEGATIVE 09/13/2019 1604   HGBUR MODERATE (A) 09/13/2019 1604   BILIRUBINUR NEGATIVE 09/13/2019 1604   KETONESUR NEGATIVE 09/13/2019 1604   PROTEINUR NEGATIVE 09/13/2019 1604   NITRITE NEGATIVE 09/13/2019 1604   LEUKOCYTESUR NEGATIVE 09/13/2019 1604    I have reviewed the labs.   Pertinent Imaging: CLINICAL DATA:  Nephrolithiasis.  EXAM: ABDOMEN - 1 VIEW  COMPARISON:  09/16/2019  FINDINGS: Bilateral nephrolithiasis again noted. What appears to be prominent stone debris noted throughout the distal left ureter. No bowel distention or free air. Degenerative change lumbar spine.  IMPRESSION: 1. Bilateral nephrolithiasis again noted. 2. What appears to be prominent stone debris noted throughout the distal left ureter.   Electronically Signed   By: [place]   On: 09/30/2019 14:15  I have independently reviewed the films and appreciate [place] of the stone in the vicinity of the left sacrum.     Assessment & Plan:   1. Ureteropelvic junction (UPJ) obstruction, left - s/p left ESWL - stone fragments persisting -Patient advised to increase fluids, continue Flomax and take pain medicine as needed to try to flush out the remaining fragments from the lower ureter -He will have a follow-up on Tuesday with Dr. Diamantina Providence for follow-up and discuss possible  surgery if fragments remain  Return for Appointment with Dr. Diamantina Providence on Tuesday.  These notes generated with voice recognition software. I apologize for typographical errors.  Zara Council, PA-C  [facility] [address]  [place], [place] [address] [phone]

## 2019-10-05 ENCOUNTER — Ambulatory Visit: Payer: 59 | Admitting: Urology

## 2019-10-05 ENCOUNTER — Other Ambulatory Visit: Payer: Self-pay

## 2019-10-05 ENCOUNTER — Ambulatory Visit
Admission: RE | Admit: 2019-10-05 | Discharge: 2019-10-05 | Disposition: A | Discharge status: Home / Self Care | Payer: 59 | Attending: Urology | Admitting: Urology

## 2019-10-05 ENCOUNTER — Other Ambulatory Visit
Admission: RE | Admit: 2019-10-05 | Discharge: 2019-10-05 | Disposition: A | Discharge status: Home / Self Care | Payer: 59 | Source: Ambulatory Visit | Attending: Urology | Admitting: Urology

## 2019-10-05 ENCOUNTER — Ambulatory Visit
Admission: RE | Admit: 2019-10-05 | Discharge: 2019-10-05 | Disposition: A | Discharge status: Home / Self Care | Payer: 59 | Source: Ambulatory Visit | Attending: Urology | Admitting: Urology

## 2019-10-05 ENCOUNTER — Encounter: Payer: Self-pay | Admitting: Urology

## 2019-10-05 VITALS — BP 147/90 | HR 80 | Ht 67.0 in | Wt 204.0 lb

## 2019-10-05 DIAGNOSIS — N201 Calculus of ureter: Secondary | ICD-10-CM | POA: Diagnosis not present

## 2019-10-05 DIAGNOSIS — N135 Crossing vessel and stricture of ureter without hydronephrosis: Secondary | ICD-10-CM

## 2019-10-05 LAB — URINALYSIS, COMPLETE (UACMP) WITH MICROSCOPIC
Bacteria, UA: NONE SEEN
Bilirubin Urine: NEGATIVE
Glucose, UA: NEGATIVE mg/dL
Ketones, ur: NEGATIVE mg/dL
Leukocytes,Ua: NEGATIVE
Nitrite: NEGATIVE
Protein, ur: NEGATIVE mg/dL
Specific Gravity, Urine: 1.025 (ref 1.005–1.030)
Squamous Epithelial / HPF: NONE SEEN (ref 0–5)
WBC, UA: NONE SEEN WBC/hpf (ref 0–5)
pH: 6.5 (ref 5.0–8.0)

## 2019-10-05 NOTE — Progress Notes (Signed)
   10/05/2019 10:12 AM   Philip Manning May 28, 1960 [id number]  Reason for visit: Follow up LEFT SWL  HPI: I saw Philip Manning back in urology clinic today for follow-up of stone disease.  He is a 58 year old male with multiple recurrent stones in the past, most recently who underwent left shockwave lithotripsy with me on 09/16/2019 for a 9 mm left UPJ stone.  He also had severe constipation prior to undergoing surgery secondary to narcotics from the ED.  On his 2-week follow-up visit, he was noted to have some fragments in the left distal ureter.  He denies any significant pain at this time and overall feels significantly improved from prior surgery.  His constipation is resolved.  He occasionally will have some twinges of left groin pain, and reports some urinary urgency and frequency.  He denies any fevers or chills.  KUB today shows migration of the fragments to the left UVJ.  We discussed options at length including ongoing medical expulsive therapy with Flomax with about a 50% chance that his stone fragments will pass in the next few weeks, further imaging with CT, or pursuing a left ureteroscopy, laser lithotripsy, and stent placement at the end of next week.  He has a vacation coming up in June, and he would like to have his residual fragments treated by the end of next week which is very reasonable.  We discussed the risks and benefits at length of observation, and surgery.  We specifically discussed the risks ureteroscopy including bleeding, infection/sepsis, stent related symptoms including flank pain/urgency/frequency/incontinence/dysuria, ureteral injury, inability to access stone, or need for staged or additional procedures.  Urinalysis and culture today Schedule left ureteroscopy, laser lithotripsy, stent placement on Thursday, June 3 Return precautions discussed at [facility], [place] [address] [place], [place]  [address] [phone]

## 2019-10-05 NOTE — Patient Instructions (Signed)
Laser Therapy for Kidney Stones, Care After This sheet gives you information about how to care for yourself after your procedure. Your health care provider may also give you more specific instructions. If you have problems or questions, contact your health care provider. What can I expect after the procedure? After the procedure, it is common to have:  Pain.  A burning sensation while urinating.  Small amounts of blood in your urine.  A need to urinate frequently.  Pieces of kidney stone in your urine.  Mild discomfort when urinating that may be felt in the back. You may experience this if you have a flexible tube (stent) in your ureter. Follow these instructions at home:  Medicines  Take over-the-counter and prescription medicines only as told by your health care provider.  If you were prescribed an antibiotic medicine, take it as told by your health care provider. Do not stop taking the antibiotic even if you start to feel better.  Ask your health care provider if the medicine prescribed to you: ? Requires you to avoid driving or using heavy machinery. ? Can cause constipation. You may need to take actions to prevent or treat constipation, such as:  Take over-the-counter or prescription medicines.  Eat foods that are high in fiber, such as beans, whole grains, and fresh fruits and vegetables.  Limit foods that are high in fat and processed sugars, such as fried or sweet foods. Activity  Return to your normal activities as told by your health care provider. Ask your health care provider what activities are safe for you.  Do not drive for 24 hours if you were given a sedative during your procedure. General instructions  If your health care provider approves, you may take a warm bath to ease discomfort and burning.  Drink enough fluid to keep your urine pale yellow. Your health care provider may recommend drinking two 8 oz (237 mL) glasses of water per hour for a few hours  after your procedure.  You may be asked to strain your urine to collect any stone fragments that you pass. These fragments may be tested.  Keep all follow-up visits as told by your health care provider. This is important. If you have a stent, you will need to return to your health care provider to have the stent removed. Contact a health care provider if you:  Have pain or a burning feeling that lasts more than 2 days.  Feel nauseous.  Vomit more and more often.  Have difficulty urinating.  Have pain that gets worse or does not get better with medicine. Get help right away if:  You are unable to urinate, even if your bladder feels full.  You have: ? Bright red blood or blood clots in your urine. ? More blood in your urine. ? Severe pain or discomfort. ? A fever or shaking chills. ? Abdominal pain. ? Difficulty breathing. ? Swelling in your legs. Summary  After the procedure, it is common to have a burning sensation while urinating and small amounts of blood in your urine.  Take over-the-counter and prescription medicines only as told by your health care provider.  Drink enough fluid to keep your urine pale yellow.  Keep all follow-up visits as told by your health care provider. This is important. This information is not intended to replace advice given to you by your health care provider. Make sure you discuss any questions you have with your health care provider. Document Revised: 01/08/2018 Document Reviewed: 01/08/2018 Elsevier  Patient Education  [employer].   Ureteral Stent Implantation  Ureteral stent implantation is a procedure to insert (implant) a flexible, soft, plastic tube (stent) into a ureter. Ureters are the tube-like parts of the body that drain urine from the kidneys. The stent supports the ureter while it heals and helps to drain urine. You may have a ureteral stent implanted after having a procedure to remove a blockage from the ureter  (ureterolysis or pyeloplasty). You may also have a stent implanted to open the flow of urine when you have a blockage caused by a kidney stone, tumor, blood clot, or infection. You have two ureters, one on each side of the body. The ureters connect the kidneys to the organ that holds urine until it passes out of the body (bladder). The stent is placed so that one end is in the kidney, and one end is in the bladder. The stent is usually taken out after your ureter has healed. Depending on your condition, you may have a stent for just a few weeks, or you may have a long-term stent that will need to be replaced every few months. Tell a health care provider about:  Any allergies you have.  All medicines you are taking, including vitamins, herbs, eye drops, creams, and over-the-counter medicines.  Any problems you or family members have had with anesthetic medicines.  Any blood disorders you have.  Any surgeries you have had.  Any medical conditions you have.  Whether you are pregnant or may be pregnant. What are the risks? Generally, this is a safe procedure. However, problems may occur, including:  Infection.  Bleeding.  Allergic reactions to medicines.  Damage to other structures or organs. Tearing (perforation) of the ureter is possible.  Movement of the stent away from where it is placed during surgery (migration). What happens before the procedure? Medicines Ask your health care provider about:  Changing or stopping your regular medicines. This is especially important if you are taking diabetes medicines or blood thinners.  Taking medicines such as aspirin and ibuprofen. These medicines can thin your blood. Do not take these medicines unless your health care provider tells you to take them.  Taking over-the-counter medicines, vitamins, herbs, and supplements. Eating and drinking Follow instructions from your health care provider about eating and drinking, which may  include:  8 hours before the procedure - stop eating heavy meals or foods, such as meat, fried foods, or fatty foods.  6 hours before the procedure - stop eating light meals or foods, such as toast or cereal.  6 hours before the procedure - stop drinking milk or drinks that contain milk.  2 hours before the procedure - stop drinking clear liquids. Staying hydrated Follow instructions from your health care provider about hydration, which may include:  Up to 2 hours before the procedure - you may continue to drink clear liquids, such as water, clear fruit juice, black coffee, and plain tea. General instructions  Do not drink alcohol.  Do not use any products that contain nicotine or tobacco for at least 4 weeks before the procedure. These products include cigarettes, e-cigarettes, and chewing tobacco. If you need help quitting, ask your health care provider.  You may have an exam or testing, such as imaging or blood tests.  Ask your health care provider what steps will be taken to help prevent infection. These may include: ? Removing hair at the surgery site. ? Washing skin with a germ-killing soap. ? Taking antibiotic  medicine.  Plan to have someone take you home from the hospital or clinic.  If you will be going home right after the procedure, plan to have someone with you for 24 hours. What happens during the procedure?  An IV will be inserted into one of your veins.  You may be given a medicine to help you relax (sedative).  You may be given a medicine to make you fall asleep (general anesthetic).  A thin, tube-shaped instrument with a light and tiny camera at the end (cystoscope) will be inserted into your urethra. The urethra is the tube that drains urine from the bladder out of the body. In men, the urethra opens at the end of the penis. In women, the urethra opens in front of the vaginal opening.  The cystoscope will be passed into your bladder.  A thin wire (guide  wire) will be passed through your bladder and into your ureter. This is used to guide the stent into your ureter.  The stent will be inserted into your ureter.  The guide wire and the cystoscope will be removed.  A flexible tube (catheter) may be inserted through your urethra so that one end is in your bladder. This helps to drain urine from your bladder. The procedure may vary among hospitals and health care providers. What happens after the procedure?  Your blood pressure, heart rate, breathing rate, and blood oxygen level will be monitored until you leave the hospital or clinic.  You may continue to receive medicine and fluids through an IV.  You may have some soreness or pain in your abdomen and urethra. Medicines will be available to help you.  You will be encouraged to get up and walk around as soon as you can.  You may have a catheter draining your urine.  You will have some blood in your urine.  Do not drive for 24 hours if you were given a sedative during your procedure. Summary  Ureteral stent implantation is a procedure to insert a flexible, soft, plastic tube (stent) into a ureter.  You may have a stent implanted to support the ureter while it heals after a procedure or to open the flow of urine if there is a blockage.  Follow instructions from your health care provider about taking medicines and about eating and drinking before the procedure.  Depending on your condition, you may have a stent for just a few weeks, or you may have a long-term stent that will need to be replaced every few months. This information is not intended to replace advice given to you by your health care provider. Make sure you discuss any questions you have with your health care provider. Document Revised: 02/03/2018 Document Reviewed: 02/04/2018 Elsevier Patient Education  2020 [employer].

## 2019-10-06 ENCOUNTER — Other Ambulatory Visit: Payer: Self-pay | Admitting: Urology

## 2019-10-06 DIAGNOSIS — N201 Calculus of ureter: Secondary | ICD-10-CM

## 2019-10-06 LAB — URINE CULTURE: Culture: NO GROWTH

## 2019-10-08 ENCOUNTER — Other Ambulatory Visit: Payer: Self-pay

## 2019-10-08 ENCOUNTER — Inpatient Hospital Stay
Admission: RE | Admit: 2019-10-08 | Discharge: 2019-10-08 | Disposition: A | Discharge status: Home / Self Care | Payer: 59 | Source: Ambulatory Visit

## 2019-10-08 HISTORY — DX: Personal history of urinary calculi: Z87.442

## 2019-10-08 NOTE — Patient Instructions (Signed)
Your procedure is scheduled on: 10/14/19 Report to [facility]. To find out your arrival time please call [phone] between 1PM - 3PM on 10/13/19.  Remember: Instructions that are not followed completely may result in serious medical risk, up to and including death, or upon the discretion of your surgeon and anesthesiologist your surgery may need to be rescheduled.     _X__ 1. Do not eat food after midnight the night before your procedure.                 No gum chewing or hard candies. You may drink clear liquids up to 2 hours                 before you are scheduled to arrive for your surgery- DO not drink clear                 liquids within 2 hours of the start of your surgery.                 Clear Liquids include:  water, apple juice without pulp, clear carbohydrate                 drink such as Clearfast or Gatorade, Black Coffee or Tea (Do not add                 anything to coffee or tea). Diabetics water only  __X__2.  On the morning of surgery brush your teeth with toothpaste and water, you                 may rinse your mouth with mouthwash if you wish.  Do not swallow any              toothpaste of mouthwash.     _X__ 3.  No Alcohol for 24 hours before or after surgery.   _X__ 4.  Do Not Smoke or use e-cigarettes For 24 Hours Prior to Your Surgery.                 Do not use any chewable tobacco products for at least 6 hours prior to                 surgery.  ____  5.  Bring all medications with you on the day of surgery if instructed.   __X__  6.  Notify your doctor if there is any change in your medical condition      (cold, fever, infections).     Do not wear jewelry, make-up, hairpins, clips or nail polish. Do not wear lotions, powders, or perfumes.  Do not shave 48 hours prior to surgery. Men may shave face and neck. Do not bring valuables to the hospital.    [facility] is not responsible for any belongings or  valuables.  Contacts, dentures/partials or body piercings may not be worn into surgery. Bring a case for your contacts, glasses or hearing aids, a denture cup will be supplied. Leave your suitcase in the car. After surgery it may be brought to your room. For patients admitted to the hospital, discharge time is determined by your treatment team.   Patients discharged the day of surgery will not be allowed to drive home.   Please read over the following fact sheets that you were given:   MRSA Information  __X__ Take these medicines the morning of surgery with A SIP OF WATER:  1. tamsulosin (FLOMAX) 0.4 MG CAPS capsule  2.   3.   4.  5.  6.  ____ Fleet Enema (as directed)   __X__ Use CHG Soap/SAGE wipes as directed  ____ Use inhalers on the day of surgery  ____ Stop metformin/Janumet/Farxiga 2 days prior to surgery    ____ Take 1/2 of usual insulin dose the night before surgery. No insulin the morning          of surgery.   ____ Stop Blood Thinners Coumadin/Plavix/Xarelto/Pleta/Pradaxa/Eliquis/Effient/Aspirin  on   Or contact your Surgeon, Cardiologist or Medical Doctor regarding  ability to stop your blood thinners  __X__ Stop Anti-inflammatories 7 days before surgery such as Advil, Ibuprofen, Motrin,  BC or Goodies Powder, Naprosyn, Naproxen, Aleve, Aspirin    __X__ Stop all herbal supplements, fish oil or vitamin E until after surgery.    ____ Bring C-Pap to the hospital.

## 2019-10-12 ENCOUNTER — Ambulatory Visit
Admission: RE | Admit: 2019-10-12 | Discharge: 2019-10-12 | Disposition: A | Discharge status: Home / Self Care | Payer: 59 | Source: Ambulatory Visit | Attending: Urology | Admitting: Urology

## 2019-10-12 ENCOUNTER — Encounter
Admission: RE | Admit: 2019-10-12 | Discharge: 2019-10-12 | Disposition: A | Discharge status: Home / Self Care | Payer: 59 | Source: Home / Self Care | Attending: Urology | Admitting: Urology

## 2019-10-12 ENCOUNTER — Other Ambulatory Visit: Payer: Self-pay | Admitting: Urology

## 2019-10-12 ENCOUNTER — Other Ambulatory Visit: Admission: RE | Admit: 2019-10-12 | Payer: 59 | Source: Ambulatory Visit

## 2019-10-12 DIAGNOSIS — N201 Calculus of ureter: Secondary | ICD-10-CM

## 2019-10-12 DIAGNOSIS — N135 Crossing vessel and stricture of ureter without hydronephrosis: Secondary | ICD-10-CM | POA: Diagnosis present

## 2019-10-13 ENCOUNTER — Telehealth: Payer: Self-pay

## 2019-10-13 NOTE — Telephone Encounter (Signed)
Called pt informed him of the information below. Pt gave verbal understanding. [facility] ordered see media. Follow up appt scheduled.

## 2019-10-13 NOTE — Telephone Encounter (Signed)
-----   Message from Billey Co, MD sent at 10/13/2019 12:00 PM EDT ----- Doristine Devoid news, left sided stones have passed.  I am glad he is feeling better.  Please have him complete a 24-hour urine and follow-up in 1 month to discuss results.  He should also bring in his stone fragments if he still has them to send for analysis.  Nickolas Madrid, MD 10/13/2019

## 2019-10-14 ENCOUNTER — Encounter: Admission: RE | Payer: Self-pay | Source: Home / Self Care

## 2019-10-14 ENCOUNTER — Ambulatory Visit: Admission: RE | Admit: 2019-10-14 | Payer: 59 | Source: Home / Self Care | Admitting: Urology

## 2019-10-14 SURGERY — CYSTOSCOPY/URETEROSCOPY/HOLMIUM LASER/STENT PLACEMENT
Anesthesia: Choice | Laterality: Left

## 2019-11-24 ENCOUNTER — Ambulatory Visit: Payer: 59 | Admitting: Urology

## 2019-12-27 ENCOUNTER — Emergency Department
Admission: EM | Admit: 2019-12-27 | Discharge: 2019-12-27 | Disposition: A | Discharge status: Home / Self Care | Payer: Self-pay | Attending: Emergency Medicine | Admitting: Emergency Medicine

## 2019-12-27 ENCOUNTER — Other Ambulatory Visit: Payer: Self-pay

## 2019-12-27 DIAGNOSIS — R509 Fever, unspecified: Secondary | ICD-10-CM | POA: Insufficient documentation

## 2019-12-27 DIAGNOSIS — U071 COVID-19: Secondary | ICD-10-CM | POA: Insufficient documentation

## 2019-12-27 DIAGNOSIS — R197 Diarrhea, unspecified: Secondary | ICD-10-CM | POA: Insufficient documentation

## 2019-12-27 LAB — BASIC METABOLIC PANEL
Anion gap: 12 (ref 5–15)
BUN: 17 mg/dL (ref 6–20)
CO2: 26 mmol/L (ref 22–32)
Calcium: 8.7 mg/dL — ABNORMAL LOW (ref 8.9–10.3)
Chloride: 99 mmol/L (ref 98–111)
Creatinine, Ser: 0.97 mg/dL (ref 0.61–1.24)
GFR calc Af Amer: >60 mL/min (ref >60)
GFR calc non Af Amer: >60 mL/min (ref >60)
Glucose, Bld: 106 mg/dL — ABNORMAL HIGH (ref 70–99)
Potassium: 3.6 mmol/L (ref 3.5–5.1)
Sodium: 137 mmol/L (ref 135–145)

## 2019-12-27 LAB — CBC
HCT: 44 % (ref 39.0–52.0)
Hemoglobin: 15.4 g/dL (ref 13.0–17.0)
MCH: 30.7 pg (ref 26.0–34.0)
MCHC: 35 g/dL (ref 30.0–36.0)
MCV: 87.8 fL (ref 80.0–100.0)
Platelets: 203 10*3/uL (ref 150–400)
RBC: 5.01 MIL/uL (ref 4.22–5.81)
RDW: 13 % (ref 11.5–15.5)
WBC: 5.3 10*3/uL (ref 4.0–10.5)
nRBC: 0 % (ref 0.0–0.2)

## 2019-12-27 NOTE — ED Triage Notes (Signed)
Pt comes via POV from home with c/o worsening COVID symptoms. Pt states he was dx 11 days ago. Pt states cough fever and diarrhea. Pt states he hasn't been able to keep anything down.

## 2020-05-17 DIAGNOSIS — E6609 Other obesity due to excess calories: Secondary | ICD-10-CM | POA: Insufficient documentation

## 2020-05-24 DIAGNOSIS — E7800 Pure hypercholesterolemia, unspecified: Secondary | ICD-10-CM | POA: Insufficient documentation

## 2020-07-15 IMAGING — CR DG ABDOMEN 1V
1 series · 2 of 2 positions shown · non-contrast
Comparison: 09/16/2019

CLINICAL DATA: Nephrolithiasis.

EXAM:
ABDOMEN - 1 VIEW

[Series 1: dg abd 1 view · 0.14mm/px · 2 of 2 slices shown]
[im 1/2]
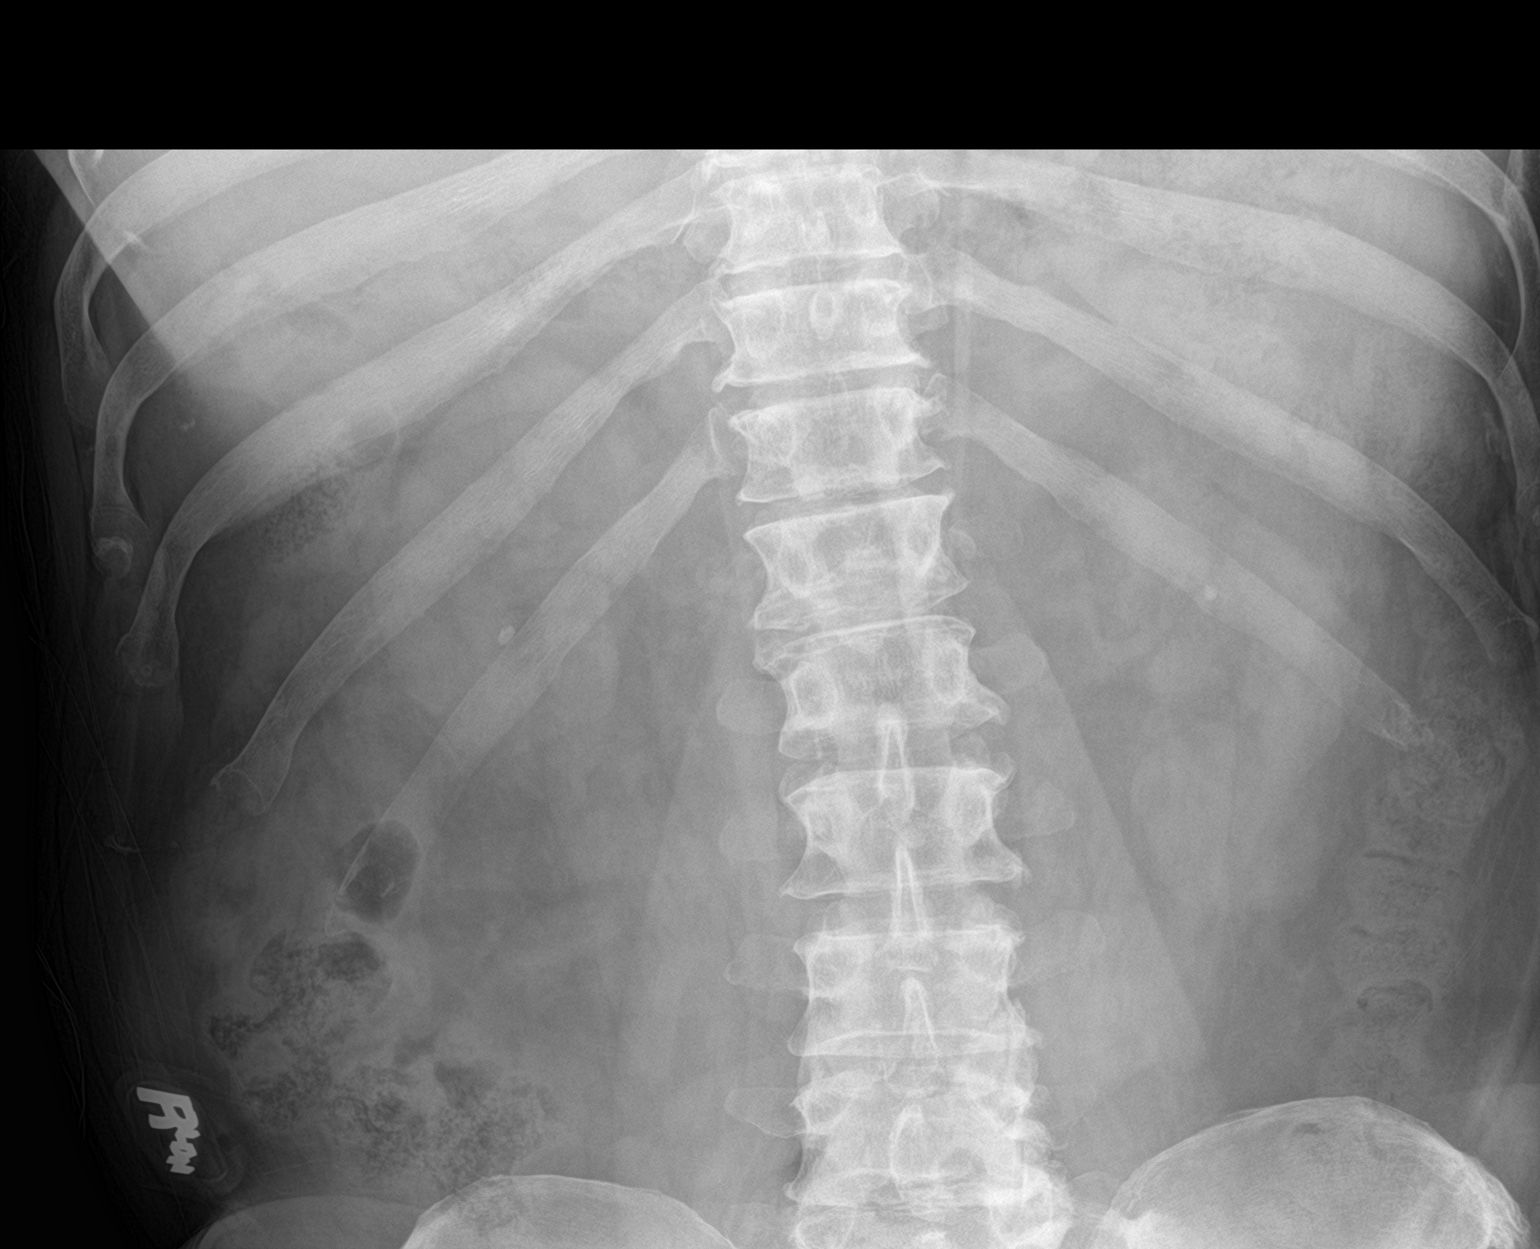
[im 2/2]
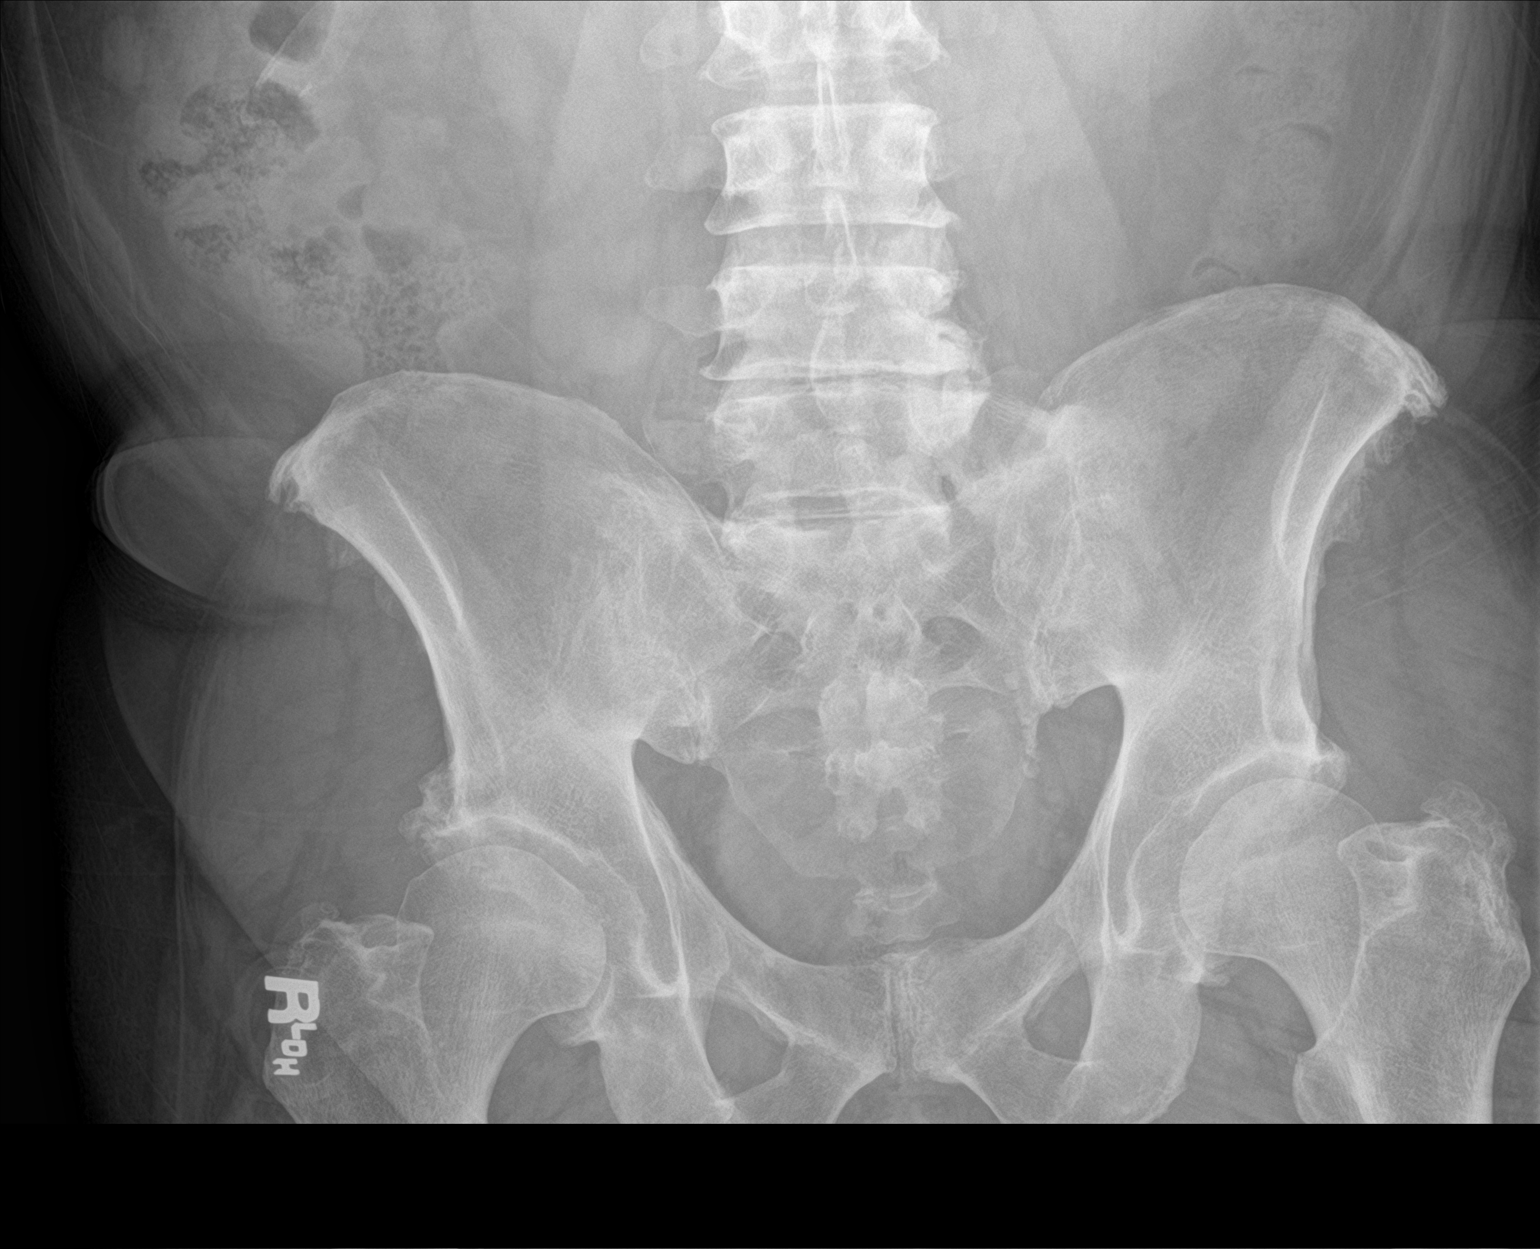

[2 of 2 positions shown; findings below may reference images not displayed]

FINDINGS: Bilateral nephrolithiasis again noted. What appears to be prominent
stone debris noted throughout the distal left ureter. No bowel
distention or free air. Degenerative change lumbar spine.
IMPRESSION: 1. Bilateral nephrolithiasis again noted. 2. What appears to be
prominent stone debris noted throughout the distal left ureter.

## 2020-09-26 ENCOUNTER — Encounter: Payer: Self-pay | Admitting: Internal Medicine

## 2020-09-27 ENCOUNTER — Ambulatory Visit
Admission: RE | Admit: 2020-09-27 | Discharge: 2020-09-27 | Disposition: A | Discharge status: Home / Self Care | Payer: 59 | Attending: Internal Medicine | Admitting: Internal Medicine

## 2020-09-27 ENCOUNTER — Ambulatory Visit: Payer: 59 | Admitting: Anesthesiology

## 2020-09-27 ENCOUNTER — Other Ambulatory Visit: Payer: Self-pay

## 2020-09-27 ENCOUNTER — Encounter: Payer: Self-pay | Admitting: Internal Medicine

## 2020-09-27 ENCOUNTER — Encounter
Admission: RE | Disposition: A | Discharge status: Home / Self Care | Payer: Self-pay | Source: Home / Self Care | Attending: Internal Medicine

## 2020-09-27 DIAGNOSIS — Z7982 Long term (current) use of aspirin: Secondary | ICD-10-CM | POA: Insufficient documentation

## 2020-09-27 DIAGNOSIS — K640 First degree hemorrhoids: Secondary | ICD-10-CM | POA: Diagnosis not present

## 2020-09-27 DIAGNOSIS — Z86010 Personal history of colonic polyps: Secondary | ICD-10-CM | POA: Diagnosis present

## 2020-09-27 DIAGNOSIS — Z1211 Encounter for screening for malignant neoplasm of colon: Secondary | ICD-10-CM | POA: Insufficient documentation

## 2020-09-27 HISTORY — PX: COLONOSCOPY WITH PROPOFOL: SHX5780

## 2020-09-27 SURGERY — COLONOSCOPY WITH PROPOFOL
Anesthesia: General

## 2020-09-27 MED ORDER — SODIUM CHLORIDE 0.9 % IV SOLN: INTRAVENOUS | Status: DC

## 2020-09-27 MED ORDER — PROPOFOL 10 MG/ML IV BOLUS
INTRAVENOUS | Status: DC | PRN
  Administered 2020-09-27: 60 mg via INTRAVENOUS
  Administered 2020-09-27: 10 mg via INTRAVENOUS

## 2020-09-27 MED ORDER — LIDOCAINE HCL (CARDIAC) PF 100 MG/5ML IV SOSY
PREFILLED_SYRINGE | INTRAVENOUS | Status: DC | PRN
  Administered 2020-09-27: 100 mg via INTRAVENOUS

## 2020-09-27 MED ORDER — PROPOFOL 500 MG/50ML IV EMUL
INTRAVENOUS | Status: DC | PRN
  Administered 2020-09-27: 155 ug/kg/min via INTRAVENOUS

## 2020-09-27 NOTE — Anesthesia Preprocedure Evaluation (Signed)
Anesthesia Evaluation  Patient identified by MRN, date of birth, ID band Patient awake    Reviewed: Allergy & Precautions, NPO status , Patient's Chart, lab work & pertinent test results  History of Anesthesia Complications Negative for: history of anesthetic complications  Airway Mallampati: III  TM Distance: >3 FB Neck ROM: Full    Dental no notable dental hx.    Pulmonary neg pulmonary ROS, neg sleep apnea, neg COPD,    breath sounds clear to auscultation- rhonchi (-) wheezing      Cardiovascular Exercise Tolerance: Good (-) hypertension(-) CAD, (-) Past MI, (-) Cardiac Stents and (-) CABG  Rhythm:Regular Rate:Normal - Systolic murmurs and - Diastolic murmurs    Neuro/Psych neg Seizures negative neurological ROS  negative psych ROS   GI/Hepatic negative GI ROS, Neg liver ROS,   Endo/Other  negative endocrine ROSneg diabetes  Renal/GU Renal disease: hx of nephrolithiasis.     Musculoskeletal negative musculoskeletal ROS (+)   Abdominal (+) + obese,   Peds  Hematology negative hematology ROS (+)   Anesthesia Other Findings Past Medical History: No date: Chronic kidney disease No date: History of kidney stones   Reproductive/Obstetrics                             Anesthesia Physical Anesthesia Plan  ASA: II  Anesthesia Plan: General   Post-op Pain Management:    Induction: Intravenous  PONV Risk Score and Plan: 1 and Propofol infusion  Airway Management Planned: Natural Airway  Additional Equipment:   Intra-op Plan:   Post-operative Plan:   Informed Consent: I have reviewed the patients History and Physical, chart, labs and discussed the procedure including the risks, benefits and alternatives for the proposed anesthesia with the patient or authorized representative who has indicated his/her understanding and acceptance.     Dental advisory given  Plan Discussed with:  CRNA and Anesthesiologist  Anesthesia Plan Comments:         Anesthesia Quick Evaluation

## 2020-09-27 NOTE — Transfer of Care (Signed)
Immediate Anesthesia Transfer of Care Note  Patient: Philip Manning  Procedure(s) Performed: COLONOSCOPY WITH PROPOFOL (N/A )  Patient Location: Endoscopy Unit  Anesthesia Type:General  Level of Consciousness: awake, drowsy and patient cooperative  Airway & Oxygen Therapy: Patient Spontanous Breathing and Patient connected to face mask oxygen  Post-op Assessment: Report given to RN and Post -op Vital signs reviewed and stable  Post vital signs: Reviewed and stable  Last Vitals:  Vitals Value Taken Time  BP    Temp    Pulse    Resp    SpO2      Last Pain:  Vitals:   09/27/20 1331  TempSrc: Temporal  PainSc: 0-No pain         Complications: No complications documented.

## 2020-09-27 NOTE — H&P (Signed)
Outpatient short stay form Pre-procedure 09/27/2020 2:06 PM Sanav Remer K. Alice Reichert, M.D.  Primary Physician: Dion Body, M.D.  Reason for visit:  Personal history of adenomatous colon polyp  History of present illness:                            Patient presents for colonoscopy for a personal hx of colon polyps. The patient denies abdominal pain, abnormal weight loss or rectal bleeding.      Current Facility-Administered Medications:  .  0.9 %  sodium chloride infusion, , Intravenous, Continuous, [place], Benay Pike, MD, Last Rate: 20 mL/hr at 09/27/20 1342, New Bag at 09/27/20 1342  Medications Prior to Admission  Medication Sig Dispense Refill Last Dose  . Fish Oil-Cholecalciferol (FISH OIL + D3 PO) Take by mouth daily.   09/24/2020  . ZINC OXIDE PO Take by mouth daily.   09/24/2020  . aspirin EC 81 MG tablet Take 81 mg by mouth daily.   09/24/2020  . ondansetron (ZOFRAN ODT) 4 MG disintegrating tablet Take 1 tablet (4 mg total) by mouth every 6 (six) hours as needed for nausea or vomiting. (Patient not taking: No sig reported) 20 tablet 0 Not Taking at Unknown time  . oxyCODONE-acetaminophen (PERCOCET/ROXICET) 5-325 MG tablet Take 1-2 tablets by mouth every 6 (six) hours as needed for severe pain. (Patient not taking: No sig reported) 16 tablet 0 Not Taking at Unknown time  . senna-docusate (SENOKOT-S) 8.6-50 MG tablet Take 1 tablet by mouth 2 (two) times daily. (Patient not taking: No sig reported) 20 tablet 0 Not Taking at Unknown time  . tamsulosin (FLOMAX) 0.4 MG CAPS capsule Take 1 capsule (0.4 mg total) by mouth daily. (Patient not taking: Reported on 09/27/2020) 30 capsule 1 Not Taking at Unknown time  . vitamin C (ASCORBIC ACID) 500 MG tablet Take 500 mg by mouth daily.   09/24/2020     No Known Allergies   Past Medical History:  Diagnosis Date  . Chronic kidney disease   . History of kidney stones     Review of systems:  Otherwise negative.    Physical Exam  Gen:  Alert, oriented. Appears stated age.  HEENT: [place]/AT. PERRLA. Lungs: CTA, no wheezes. CV: RR nl S1, S2. Abd: soft, benign, no masses. BS+ Ext: No edema. Pulses 2+    Planned procedures: Proceed with colonoscopy. The patient understands the nature of the planned procedure, indications, risks, alternatives and potential complications including but not limited to bleeding, infection, perforation, damage to internal organs and possible oversedation/side effects from anesthesia. The patient agrees and gives consent to proceed.  Please refer to procedure notes for findings, recommendations and patient disposition/instructions.     Devantae Babe K. Alice Reichert, M.D. Gastroenterology 09/27/2020  2:06 PM

## 2020-09-27 NOTE — Anesthesia Procedure Notes (Signed)
Procedure Name: General with mask airway Performed by: Fletcher-Harrison, Symir Mah, CRNA Pre-anesthesia Checklist: Patient identified, Emergency Drugs available, Suction available and Patient being monitored Patient Re-evaluated:Patient Re-evaluated prior to induction Oxygen Delivery Method: Simple face mask Induction Type: IV induction Placement Confirmation: positive ETCO2 and CO2 detector Dental Injury: Teeth and Oropharynx as per pre-operative assessment        

## 2020-09-27 NOTE — Interval H&P Note (Signed)
History and Physical Interval Note:  09/27/2020 2:07 PM  [name]  has presented today for surgery, with the diagnosis of [place].  The various methods of treatment have been discussed with the patient and family. After consideration of risks, benefits and other options for treatment, the patient has consented to  Procedure(s): COLONOSCOPY WITH PROPOFOL (N/A) as a surgical intervention.  The patient's history has been reviewed, patient examined, no change in status, stable for surgery.  I have reviewed the patient's chart and labs.  Questions were answered to the patient's satisfaction.     [place], [place]

## 2020-09-27 NOTE — Op Note (Signed)
[facility] Patient Name: Philip Manning Procedure Date: 09/27/2020 2:02 PM MRN: [id number] Account #: [id number] Date of Birth: 1960-07-02 Admit Type: Outpatient Age: 60 Room: [facility] ENDO ROOM 2 Gender: Male Note Status: Finalized Procedure:             Colonoscopy Indications:           High risk colon cancer surveillance: Personal history                         of non-advanced adenoma Providers:             Benay Pike. Emeri Estill MD, MD Medicines:             Propofol per Anesthesia Complications:         No immediate complications. Procedure:             Pre-Anesthesia Assessment:                        - The risks and benefits of the procedure and the                         sedation options and risks were discussed with the                         patient. All questions were answered and informed                         consent was obtained.                        - Patient identification and proposed procedure were                         verified prior to the procedure by the nurse. The                         procedure was verified in the procedure room.                        - ASA Grade Assessment: III - A patient with severe                         systemic disease.                        - After reviewing the risks and benefits, the patient                         was deemed in satisfactory condition to undergo the                         procedure.                        After obtaining informed consent, the colonoscope was                         passed under direct vision. Throughout the procedure,  the patient's blood pressure, pulse, and oxygen                         saturations were monitored continuously. The                         Colonoscope was introduced through the anus and                         advanced to the the cecum, identified by appendiceal                         orifice and ileocecal valve.  The colonoscopy was                         performed without difficulty. The patient tolerated                         the procedure well. The quality of the bowel                         preparation was good. The ileocecal valve, appendiceal                         orifice, and rectum were photographed. Findings:      The perianal and digital rectal examinations were normal. Pertinent       negatives include normal sphincter tone and no palpable rectal lesions.      The colon (entire examined portion) appeared normal.      Non-bleeding internal hemorrhoids were found during retroflexion. The       hemorrhoids were Grade I (internal hemorrhoids that do not prolapse). Impression:            - The entire examined colon is normal.                        - Non-bleeding internal hemorrhoids.                        - No specimens collected. Recommendation:        - Patient has a contact number available for                         emergencies. The signs and symptoms of potential                         delayed complications were discussed with the patient.                         Return to normal activities tomorrow. Written                         discharge instructions were provided to the patient.                        - Resume previous diet.                        - Continue present medications.                        -  Repeat colonoscopy in 10 years for screening                         purposes.                        - Return to GI office PRN.                        - The findings and recommendations were discussed with                         the patient. Procedure Code(s):     --- Professional ---                        Z3007, Colorectal cancer screening; colonoscopy on                         individual at high risk Diagnosis Code(s):     --- Professional ---                        K64.0, First degree hemorrhoids                        Z86.010, Personal history of colonic  polyps CPT copyright 2019 American Medical Association. All rights reserved. The codes documented in this report are preliminary and upon coder review may  be revised to meet current compliance requirements. Efrain Sella MD, MD 09/27/2020 2:26:40 PM This report has been signed electronically. Number of Addenda: 0 Note Initiated On: 09/27/2020 2:02 PM Scope Withdrawal Time: 0 hours 6 minutes 20 seconds  Total Procedure Duration: 0 hours 8 minutes 35 seconds  Estimated Blood Loss:  Estimated blood loss: none.      [facility]

## 2020-09-27 NOTE — Anesthesia Postprocedure Evaluation (Signed)
Anesthesia Post Note  Patient: DEWAYNE SEVERE  Procedure(s) Performed: COLONOSCOPY WITH PROPOFOL (N/A )  Patient location during evaluation: Endoscopy Anesthesia Type: General Level of consciousness: awake and alert and oriented Pain management: pain level controlled Vital Signs Assessment: post-procedure vital signs reviewed and stable Respiratory status: spontaneous breathing, nonlabored ventilation and respiratory function stable Cardiovascular status: blood pressure returned to baseline and stable Postop Assessment: no signs of nausea or vomiting Anesthetic complications: no   No complications documented.   Last Vitals:  Vitals:   09/27/20 1437 09/27/20 1447  BP: (!) 127/97 (!) 141/92  Pulse: 86 78  Resp: 15 14  Temp:    SpO2: 98% 97%    Last Pain:  Vitals:   09/27/20 1447  TempSrc:   PainSc: 0-No pain                 Amelie Caracci

## 2020-09-28 ENCOUNTER — Encounter: Payer: Self-pay | Admitting: Internal Medicine

## 2021-09-12 ENCOUNTER — Encounter: Payer: Self-pay | Admitting: Podiatry

## 2021-09-12 ENCOUNTER — Ambulatory Visit: Payer: 59 | Admitting: Podiatry

## 2021-09-12 DIAGNOSIS — B351 Tinea unguium: Secondary | ICD-10-CM

## 2021-09-12 NOTE — Progress Notes (Signed)
?  Subjective:  ?Patient ID: Philip Manning, male    DOB: [birth year],  MRN: [id number] ? ?Chief Complaint  ?Patient presents with  ? Nail Problem  ?   NP  R ft fungus on several of the nails  ? ? ?61 y.o. male presents with the above complaint. History confirmed with patient.  He saw dermatology about a year ago and they recommended he take the Lamisil they sent a prescription for this, he was concerned about possibility of liver injury and so did not take it. ? ? ?Objective:  ?Physical Exam: ?warm, good capillary refill, no trophic changes or ulcerative lesions, normal DP and PT pulses, normal sensory exam, and onychomycosis. ? ? ?Assessment:  ? ?1. Onychomycosis   ? ? ? ?Plan:  ?Patient was evaluated and treated and all questions answered. ? ?We discussed treatment options for nail fungus including laser topical and oral treatment.  We discussed the risks of liver metabolism.  I think he would be a good candidate for the medication.  I did order LFTs and we will monitor this throughout.  I will see him back in 3 months for evaluation.  New photographs were taken today. ? ?Return in about 3 months (around 12/13/2021) for follow up after nail fungus treatment.  ? ?

## 2021-09-13 LAB — HEPATIC FUNCTION PANEL
ALT: 27 IU/L (ref 0–44)
AST: 17 IU/L (ref 0–40)
Albumin: 4.5 g/dL (ref 3.8–4.8)
Alkaline Phosphatase: 54 IU/L (ref 44–121)
Bilirubin Total: 0.3 mg/dL (ref 0.0–1.2)
Bilirubin, Direct: <0.1 mg/dL (ref 0.00–0.40)
Total Protein: 6.9 g/dL (ref 6.0–8.5)

## 2021-09-18 ENCOUNTER — Telehealth: Payer: Self-pay | Admitting: Podiatry

## 2021-09-18 MED ORDER — TERBINAFINE HCL 250 MG PO TABS: 250.0000 mg | ORAL_TABLET | Freq: Every day | ORAL | 0 refills | Status: AC

## 2021-09-18 NOTE — Telephone Encounter (Signed)
Patient called and stated his blood work was done on 09/12/2021 and he wanted to know if Dr. Sherryle Lis has reviewed so that he can start taking the Lamisil  ?

## 2021-10-17 ENCOUNTER — Other Ambulatory Visit: Payer: Self-pay

## 2021-10-17 ENCOUNTER — Emergency Department: Payer: 59

## 2021-10-17 ENCOUNTER — Emergency Department
Admission: EM | Admit: 2021-10-17 | Discharge: 2021-10-17 | Disposition: A | Discharge status: Home / Self Care | Payer: 59 | Attending: Emergency Medicine | Admitting: Emergency Medicine

## 2021-10-17 DIAGNOSIS — I10 Essential (primary) hypertension: Secondary | ICD-10-CM | POA: Diagnosis not present

## 2021-10-17 DIAGNOSIS — R109 Unspecified abdominal pain: Secondary | ICD-10-CM | POA: Diagnosis not present

## 2021-10-17 LAB — CBC
HCT: 46.5 % (ref 39.0–52.0)
Hemoglobin: 15.4 g/dL (ref 13.0–17.0)
MCH: 30.3 pg (ref 26.0–34.0)
MCHC: 33.1 g/dL (ref 30.0–36.0)
MCV: 91.5 fL (ref 80.0–100.0)
Platelets: 262 10*3/uL (ref 150–400)
RBC: 5.08 MIL/uL (ref 4.22–5.81)
RDW: 12.2 % (ref 11.5–15.5)
WBC: 8.7 10*3/uL (ref 4.0–10.5)
nRBC: 0 % (ref 0.0–0.2)

## 2021-10-17 LAB — COMPREHENSIVE METABOLIC PANEL
ALT: 26 U/L (ref 0–44)
AST: 24 U/L (ref 15–41)
Albumin: 4.4 g/dL (ref 3.5–5.0)
Alkaline Phosphatase: 49 U/L (ref 38–126)
Anion gap: 7 (ref 5–15)
BUN: 13 mg/dL (ref 8–23)
CO2: 24 mmol/L (ref 22–32)
Calcium: 9.1 mg/dL (ref 8.9–10.3)
Chloride: 106 mmol/L (ref 98–111)
Creatinine, Ser: 0.91 mg/dL (ref 0.61–1.24)
GFR, Estimated: >60 mL/min (ref >60)
Glucose, Bld: 117 mg/dL — ABNORMAL HIGH (ref 70–99)
Potassium: 4.1 mmol/L (ref 3.5–5.1)
Sodium: 137 mmol/L (ref 135–145)
Total Bilirubin: 0.9 mg/dL (ref 0.3–1.2)
Total Protein: 7.4 g/dL (ref 6.5–8.1)

## 2021-10-17 LAB — URINALYSIS, ROUTINE W REFLEX MICROSCOPIC
Bilirubin Urine: NEGATIVE
Glucose, UA: NEGATIVE mg/dL
Hgb urine dipstick: NEGATIVE
Ketones, ur: NEGATIVE mg/dL
Leukocytes,Ua: NEGATIVE
Nitrite: NEGATIVE
Protein, ur: NEGATIVE mg/dL
Specific Gravity, Urine: 1.005 (ref 1.005–1.030)
pH: 7 (ref 5.0–8.0)

## 2021-10-17 LAB — LIPASE, BLOOD: Lipase: 39 U/L (ref 11–51)

## 2021-10-17 MED ORDER — IBUPROFEN 600 MG PO TABS: 600.0000 mg | ORAL_TABLET | Freq: Four times a day (QID) | ORAL | 0 refills | Status: AC | PRN

## 2021-10-17 MED ORDER — LIDOCAINE 5 % EX PTCH: 1.0000 | MEDICATED_PATCH | Freq: Two times a day (BID) | CUTANEOUS | 0 refills | Status: AC

## 2021-10-17 MED ORDER — KETOROLAC TROMETHAMINE 15 MG/ML IJ SOLN
15.0000 mg | Freq: Once | INTRAMUSCULAR | Status: AC
  Administered 2021-10-17: 15 mg via INTRAVENOUS
  Filled 2021-10-17: qty 1

## 2021-10-17 MED ORDER — LIDOCAINE 5 % EX PTCH
1.0000 | MEDICATED_PATCH | CUTANEOUS | Status: DC
  Administered 2021-10-17: 1 via TRANSDERMAL
  Filled 2021-10-17: qty 1

## 2021-10-17 MED ORDER — KETOROLAC TROMETHAMINE 30 MG/ML IJ SOLN: 30.0000 mg | Freq: Once | INTRAMUSCULAR | Status: DC

## 2021-10-17 MED ORDER — ACETAMINOPHEN 500 MG PO TABS
1000.0000 mg | ORAL_TABLET | Freq: Once | ORAL | Status: AC
  Administered 2021-10-17: 1000 mg via ORAL
  Filled 2021-10-17: qty 2

## 2021-10-17 NOTE — ED Provider Notes (Signed)
[facility] Provider Note    Event Date/Time   First MD Initiated Contact with Patient 10/17/21 1444     (approximate)   History   Flank Pain   HPI  Philip Manning is a 61 y.o. male who comes in with left-sided flank pain for the past few weeks denies any issues with urination.  He reports having a history of kidney issues which is why he presented today because he had to have lithotripsy previously and he wanted to make sure that he did not have a kidney stone.  Does report that this pain does feel different than his kidney stone pain and this pain is worse when he does certain movements with his body including different twisting.  He denies any numbness or weakness in his legs, urinating or defecating on himself.  He does have prior back surgeries but denies it feeling similar to the pain from his back surgery.  He reports at rest he has minimal pain.  He denies taking anything for the pain at home.  Physical Exam   Triage Vital Signs: ED Triage Vitals [10/17/21 1417]  Enc Vitals Group     BP (!) 153/101     Pulse Rate 89     Resp 19     Temp 98 F (36.7 C)     Temp src      SpO2 97 %     Weight      Height      Head Circumference      Peak Flow      Pain Score      Pain Loc      Pain Edu?      Excl. in [place]?     Most recent vital signs: Vitals:   10/17/21 1417  BP: (!) 153/101  Pulse: 89  Resp: 19  Temp: 98 F (36.7 C)  SpO2: 97%     General: Awake, no distress.  CV:  Good peripheral perfusion.  Resp:  Normal effort.  Abd:  No distention.  Soft and nontender Other:  Patient has some tenderness to the left flank area.  There are no skin changes.  He is prior scars noted over his midline spine but he has no tenderness along midline.  He denies any falls to suggest fractures.  He has sensation in his legs and good strength in his legs.  He is able to ambulate./  ED Results / Procedures / Treatments   Labs (all labs ordered are  listed, but only abnormal results are displayed) Labs Reviewed  URINALYSIS, ROUTINE W REFLEX MICROSCOPIC - Abnormal; Notable for the following components:      Result Value   Color, Urine STRAW (*)    APPearance CLEAR (*)    All other components within normal limits  COMPREHENSIVE METABOLIC PANEL - Abnormal; Notable for the following components:   Glucose, Bld 117 (*)    All other components within normal limits  CBC       RADIOLOGY I have reviewed the xray personally and agree with radiology read  No acute finding to explain the clinical presentation. Few small nonobstructing renal calculi, the largest on the left measuring 3 x 4 mm. No hydronephrosis, passing stone or stone in the bladder.   Tiny gallstone dependent in the gallbladder. No CT evidence of cholecystitis or obstruction.       PROCEDURES:  Critical Care performed: No  Procedures   MEDICATIONS ORDERED IN ED: Medications  ketorolac (TORADOL)  15 MG/ML injection 15 mg (15 mg Intravenous Given 10/17/21 1423)     IMPRESSION / MDM / ASSESSMENT AND PLAN / ED COURSE  I reviewed the triage vital signs and the nursing notes.   Patient's presentation is most consistent with acute presentation with potential threat to life or bodily function.  This concerning for possible kidney stone, perforation, diverticulitis.  CT imaging ordered from triage.  CT imaging was reassuring but I look through it and his bladder does look a little distended.  We will have patient urinate and do a postvoid bladder scan to make sure he is not having any urinary retention.  I did call radiology who felt like the bladder just looked like somebody who needed to urinate and there is no other evidence of retention.  His prostate was normal.  He is not tender in his abdomen I have lower suspicion for cholecystitis, pancreatitis.  The fact this is been going on for 3 weeks I do not feel that this represents a AAA rupture.   There was comment of  some gallstones but he is nontender in his right upper abdomen and we discussed need to follow-up with surgery if he has issues with this.  He has no midline tenderness no symptoms of cord compression at this time.  CMP is normal.  UA without evidence of UTI.  CBC normal.  Patient was able to void and have 50 cc residual which I did confirm that there was no evidence of postvoid residual on my bedside ultrasound.  Patient feeling slightly improved with medications.  We discussed symptomatic treatment with Tylenol, ibuprofen, lidocaine patches and following up with his back surgeon if things are not getting better but at this time I do not feel that this is representative of cord compression and seems more musculoskeletal in nature  He can follow-up with his primary care doctor for recheck of his blood pressure given he was slightly hypertensive here   FINAL CLINICAL IMPRESSION(S) / ED DIAGNOSES   Final diagnoses:  Left flank pain  Hypertension, unspecified type     Rx / DC Orders   ED Discharge Orders     None        Note:  This document was prepared using Dragon voice recognition software and may include unintentional dictation errors.   Vanessa [place], MD 10/17/21 [phone]

## 2021-10-17 NOTE — ED Notes (Signed)
Pt given discharge instructions, pt verbalized understanding and need to follow up signature pad at bedside not working

## 2021-10-17 NOTE — ED Provider Triage Note (Signed)
  Emergency Medicine Provider Triage Evaluation Note  Philip Manning , a 61 y.o.male,  was evaluated in triage.  Pt complains of left-sided flank pain x2 weeks.  He states is reportedly gotten worse.  No dysuria.  No nausea/vomiting.  He states that he has had a few kidney stones in the past and this feels very similar.   Review of Systems  Positive: Left-sided flank pain Negative: Denies fever, chest pain, vomiting  Physical Exam   Vitals:   10/17/21 1417  BP: (!) 153/101  Pulse: 89  Resp: 19  Temp: 98 F (36.7 C)  SpO2: 97%   Gen:   Awake, no distress   Resp:  Normal effort  MSK:   Moves extremities without difficulty  Other:  N/A.  Medical Decision Making  Given the patient's initial medical screening exam, the following diagnostic evaluation has been ordered. The patient will be placed in the appropriate treatment space, once one is available, to complete the evaluation and treatment. I have discussed the plan of care with the patient and I have advised the patient that an ED physician or mid-level practitioner will reevaluate their condition after the test results have been received, as the results may give them additional insight into the type of treatment they may need.    Diagnostics: Labs, UA, CT renal  Treatments: Ketorolac   Teodoro Spray, [place] 10/17/21 1420

## 2021-10-17 NOTE — ED Notes (Signed)
Pt bladder scanned post void with 60 ml's noted, dr Jari Pigg aware

## 2021-10-17 NOTE — ED Triage Notes (Signed)
Pt come with c/o left sided flank pain for few weeks now. Pt states pain has gotten worse. Pt states no trouble with urination. Pt denies any N/V/D.

## 2021-10-17 NOTE — ED Notes (Signed)
Bladder scan performed  post void and bladder scanner read between 150 ml's and 200 ml's, dr Jari Pigg made aware

## 2021-10-17 NOTE — ED Notes (Signed)
Pt able to use the urinal and had a urine output of 100 ml's

## 2021-10-17 NOTE — Discharge Instructions (Signed)
Take the ibuprofen with food and alternate with Tylenol 1 g every 8 hours and use the lidocaine patches.  Follow with your primary care doctor for recheck of your blood pressure and if your symptoms or not getting any better you should follow-up with your primary doctor or your back surgeon to discuss further options.  Return to the ER if develop worsening symptoms, weakness in 1 leg, numbness in 1 leg or any other concern

## 2021-12-12 ENCOUNTER — Encounter: Payer: 59 | Admitting: Podiatry

## 2022-08-02 IMAGING — CT CT RENAL STONE PROTOCOL
2 of 4 series · 15 of 46 positions shown, 17 images · non-contrast
Comparison: Radiography 10/12/2019.  CT 09/13/2019.

CLINICAL DATA: Flank pain, kidney stone suspected. Symptoms on the
left.



[Series 2: ap without · axial · non-contrast · 0.83mm/px · z∈[-1083,-553]mm · 12 of 122 slices shown, 14 images]
[im 8/122  soft-tissue]
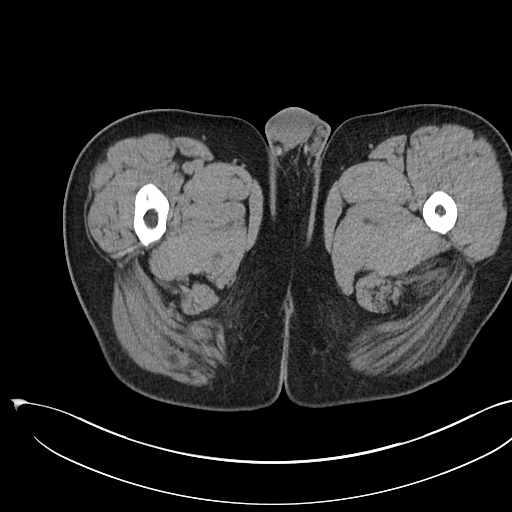
[im 8/122  bone]
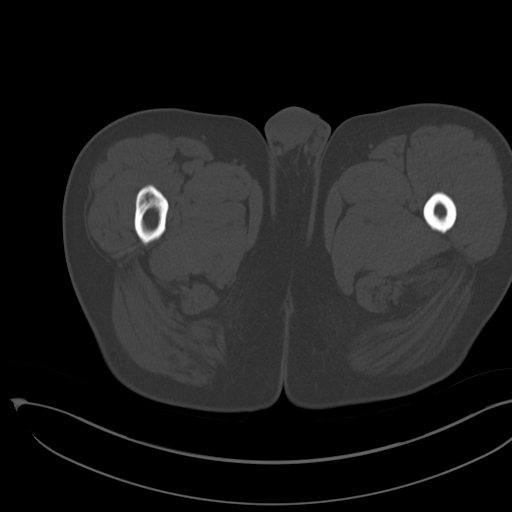
[im 16/122  soft-tissue]
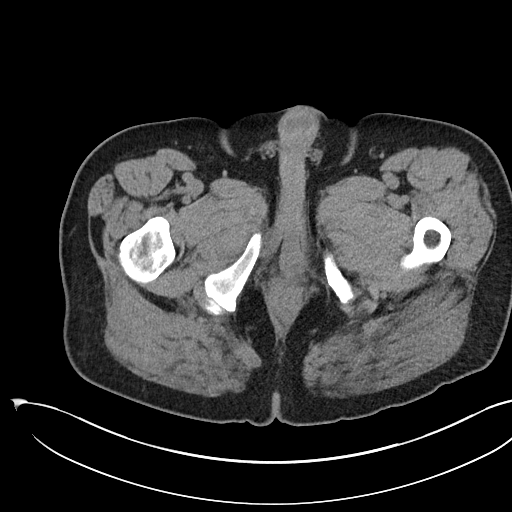
[im 31/122  soft-tissue]
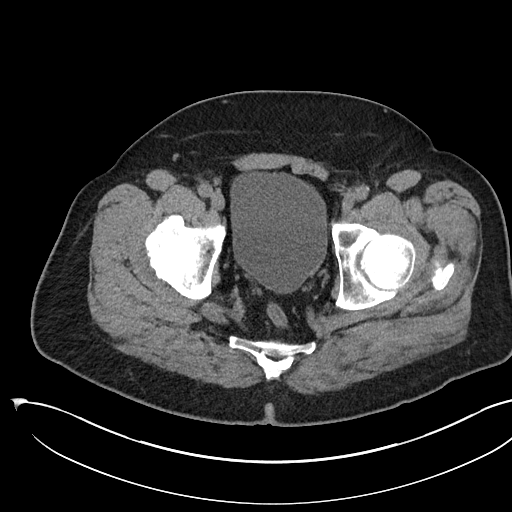
[im 38/122  soft-tissue]
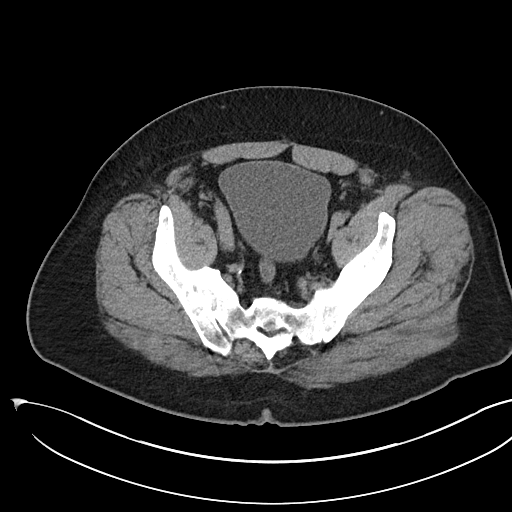
[im 46/122  soft-tissue]
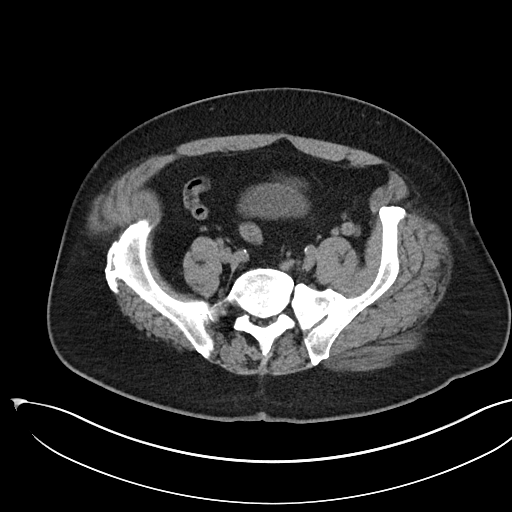
[im 53/122  soft-tissue]
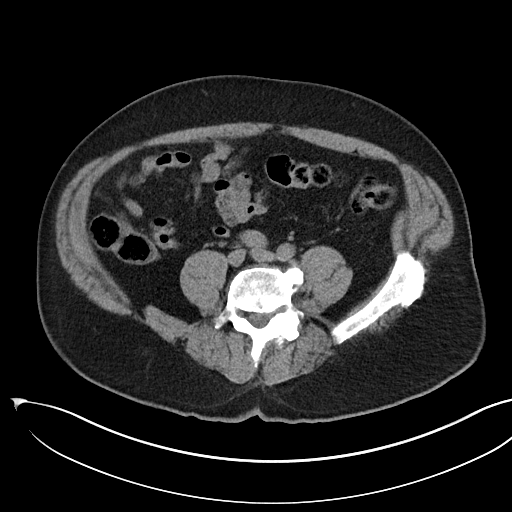
[im 69/122  soft-tissue]
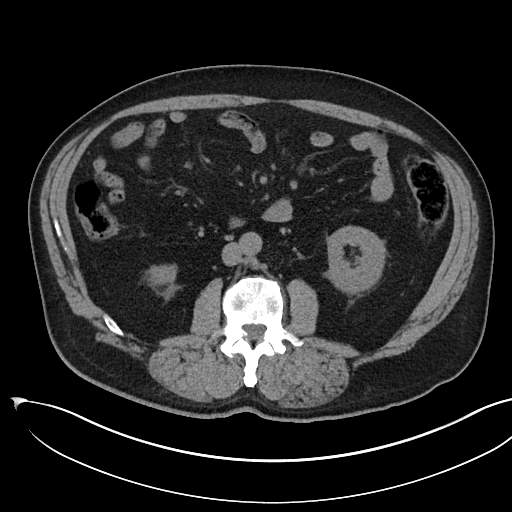
[im 76/122  soft-tissue]
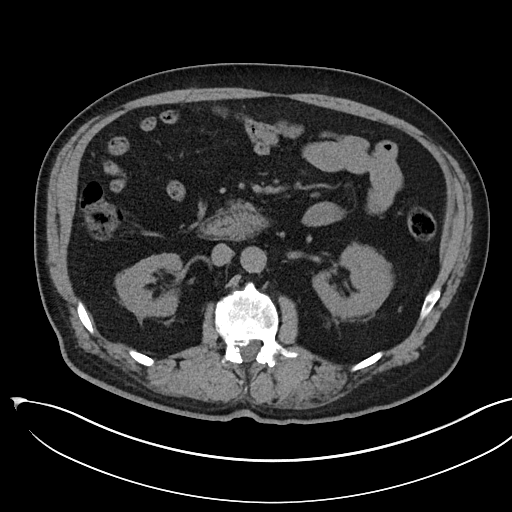
[im 84/122  soft-tissue]
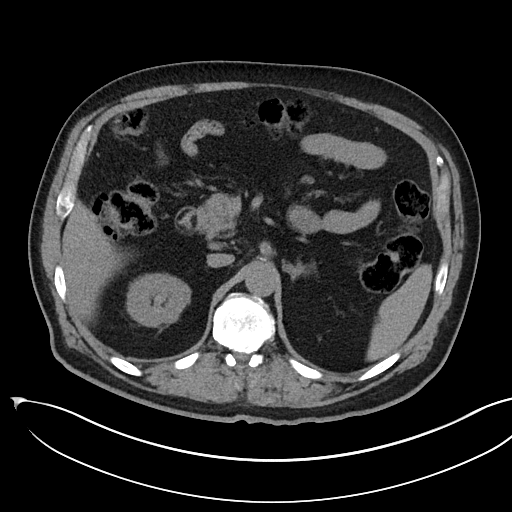
[im 84/122  bone]
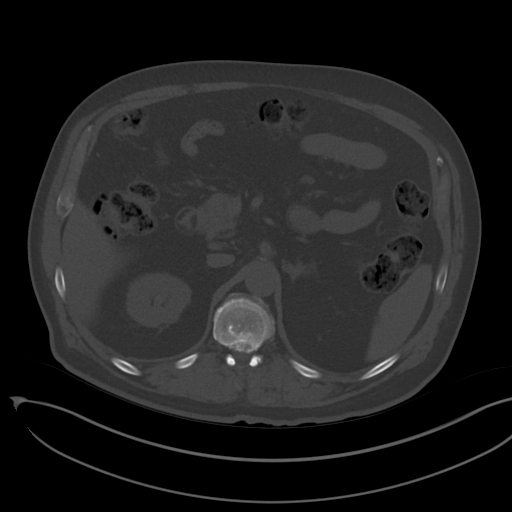
[im 91/122  soft-tissue]
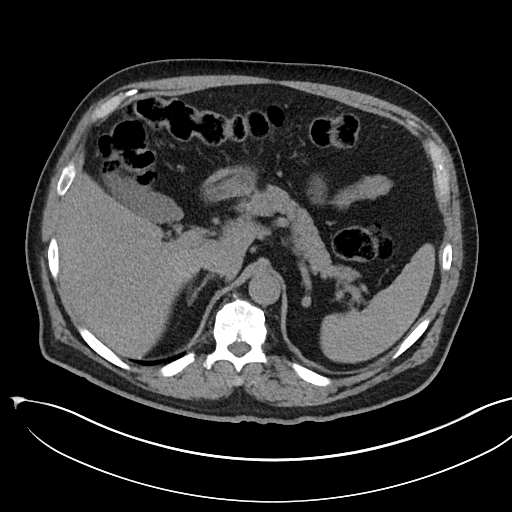
[im 106/122  soft-tissue]
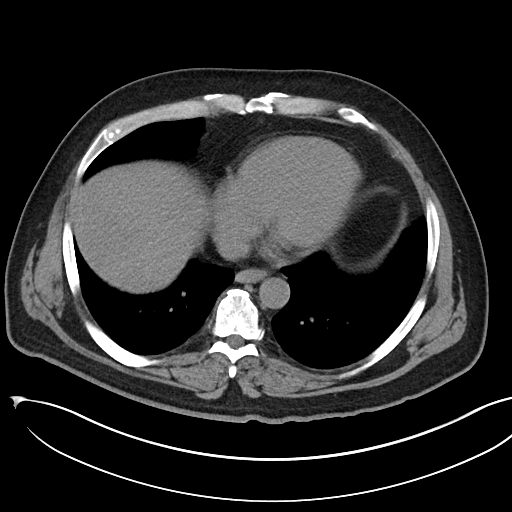
[im 114/122  soft-tissue]
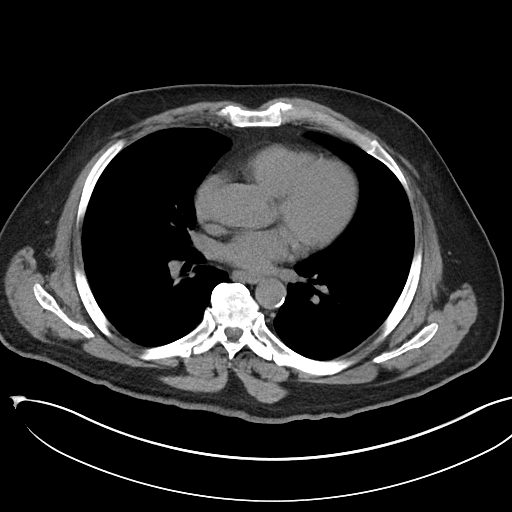

[Series 5: cor · coronal · 0.82mm/px · 3 of 99 slices shown]
[im 33/99  soft-tissue]
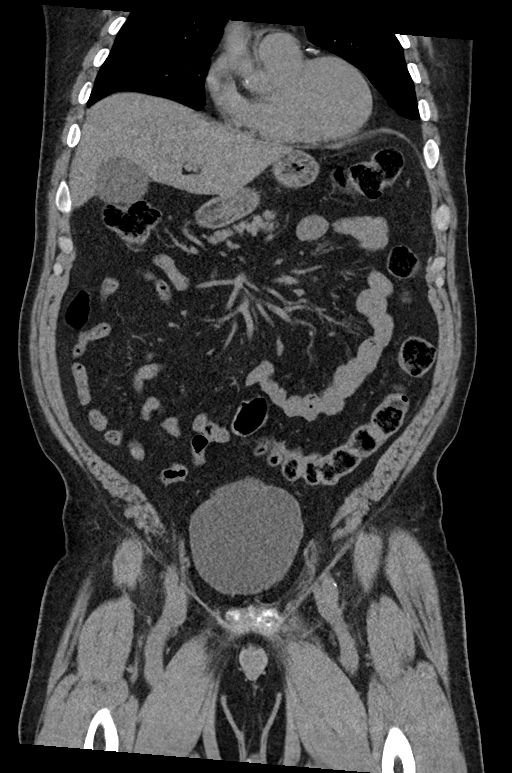
[im 44/99  soft-tissue]
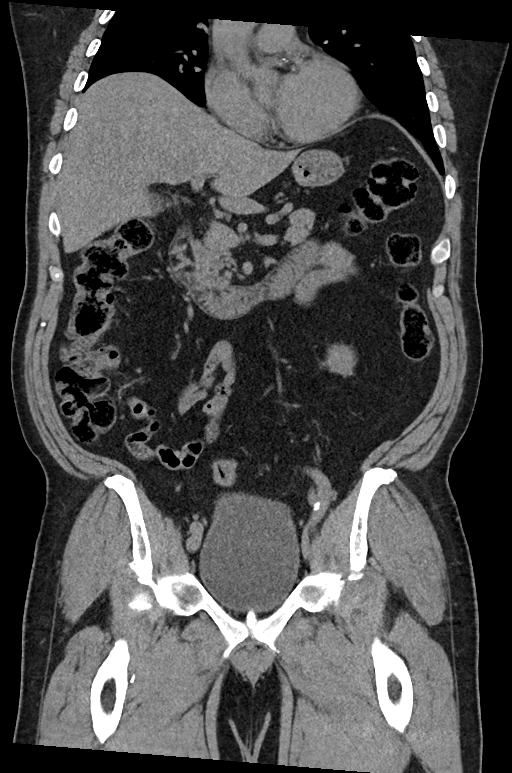
[im 55/99  soft-tissue]
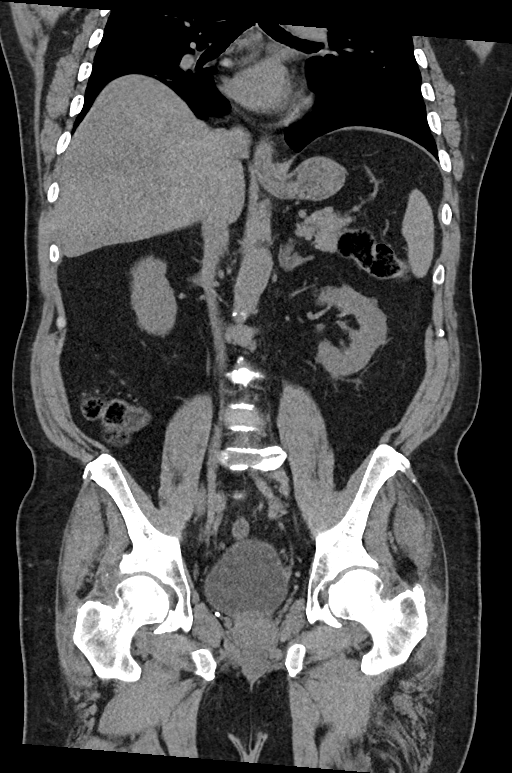

[15 of 46 positions shown; findings below may reference images not displayed]

FINDINGS: Lower chest: Mild atelectasis or scarring at the lung bases. No
pleural fluid.

Hepatobiliary: No liver parenchymal abnormality. Small stone
dependent within the gallbladder. No CT evidence of cholecystitis or
obstruction.

Pancreas: Normal

Spleen: Normal

Adrenals/Urinary Tract: Adrenal glands are normal. Right kidney
contains a few tiny nonobstructing stones. Left kidney contains a 3
x 4 mm nonobstructing stone in the midportion. No hydronephrosis or
passing stone. No stone in the bladder or urethra.

Stomach/Bowel: Stomach and small intestine are normal. No visible
appendix. No abnormal colon finding.

Vascular/Lymphatic: Aortic atherosclerosis. No aneurysm. IVC is
normal. No adenopathy.

Reproductive: Normal

Other: No free fluid or air.

Musculoskeletal: Ordinary lumbar degenerative changes.
IMPRESSION: No acute finding to explain the clinical presentation. Few small
nonobstructing renal calculi, the largest on the left measuring 3 x
4 mm. No hydronephrosis, passing stone or stone in the bladder.

Tiny gallstone dependent in the gallbladder. No CT evidence of
cholecystitis or obstruction.

## 2022-08-08 ENCOUNTER — Other Ambulatory Visit: Payer: Self-pay

## 2022-08-08 DIAGNOSIS — Z9189 Other specified personal risk factors, not elsewhere classified: Secondary | ICD-10-CM

## 2022-08-08 DIAGNOSIS — E7800 Pure hypercholesterolemia, unspecified: Secondary | ICD-10-CM

## 2022-08-20 ENCOUNTER — Ambulatory Visit
Admission: RE | Admit: 2022-08-20 | Discharge: 2022-08-20 | Disposition: A | Discharge status: Home / Self Care | Payer: 59 | Source: Ambulatory Visit | Attending: Family Medicine | Admitting: Family Medicine

## 2022-08-20 DIAGNOSIS — Z9189 Other specified personal risk factors, not elsewhere classified: Secondary | ICD-10-CM

## 2022-08-20 DIAGNOSIS — E7800 Pure hypercholesterolemia, unspecified: Secondary | ICD-10-CM

## 2022-10-01 ENCOUNTER — Telehealth (HOSPITAL_COMMUNITY): Payer: Self-pay | Admitting: *Deleted

## 2022-10-01 ENCOUNTER — Other Ambulatory Visit: Payer: Self-pay | Admitting: Physician Assistant

## 2022-10-01 DIAGNOSIS — Z8249 Family history of ischemic heart disease and other diseases of the circulatory system: Secondary | ICD-10-CM

## 2022-10-01 DIAGNOSIS — I2510 Atherosclerotic heart disease of native coronary artery without angina pectoris: Secondary | ICD-10-CM

## 2022-10-01 DIAGNOSIS — R931 Abnormal findings on diagnostic imaging of heart and coronary circulation: Secondary | ICD-10-CM

## 2022-10-01 NOTE — Telephone Encounter (Signed)
Reaching out to patient to offer assistance regarding upcoming cardiac imaging study; pt verbalizes understanding of appt date/time, parking situation and where to check in, pre-test NPO status and medications ordered, and verified current allergies; name and call back number provided for further questions should they arise  Larey Brick RN Navigator Cardiac Imaging [facility] [phone] office [phone] cell  Patient to reports low resting HR in the morning. He will check his HR and take 50mg  metoprolol if HR is greater than 60bpm take 100mg  if HR is greater than 70bpm two hours prior to his cardiac CT Scan.

## 2022-10-02 ENCOUNTER — Other Ambulatory Visit (HOSPITAL_COMMUNITY): Payer: Self-pay | Admitting: *Deleted

## 2022-10-02 MED ORDER — METOPROLOL TARTRATE 100 MG PO TABS: ORAL_TABLET | ORAL | 0 refills | Status: DC

## 2022-10-03 ENCOUNTER — Other Ambulatory Visit: Payer: Self-pay | Admitting: Physician Assistant

## 2022-10-03 ENCOUNTER — Ambulatory Visit
Admission: RE | Admit: 2022-10-03 | Discharge: 2022-10-03 | Disposition: A | Discharge status: Home / Self Care | Payer: 59 | Source: Ambulatory Visit | Attending: Physician Assistant | Admitting: Physician Assistant

## 2022-10-03 DIAGNOSIS — Z8249 Family history of ischemic heart disease and other diseases of the circulatory system: Secondary | ICD-10-CM | POA: Insufficient documentation

## 2022-10-03 DIAGNOSIS — I2510 Atherosclerotic heart disease of native coronary artery without angina pectoris: Secondary | ICD-10-CM | POA: Insufficient documentation

## 2022-10-03 DIAGNOSIS — R931 Abnormal findings on diagnostic imaging of heart and coronary circulation: Secondary | ICD-10-CM

## 2022-10-03 MED ORDER — IOHEXOL 350 MG/ML SOLN
100.0000 mL | Freq: Once | INTRAVENOUS | Status: AC | PRN
  Administered 2022-10-03: 100 mL via INTRAVENOUS

## 2022-10-03 MED ORDER — NITROGLYCERIN 0.4 MG SL SUBL
0.8000 mg | SUBLINGUAL_TABLET | Freq: Once | SUBLINGUAL | Status: AC
  Administered 2022-10-03: 0.8 mg via SUBLINGUAL

## 2022-10-03 NOTE — Progress Notes (Signed)
Patient tolerated CT well. Drank water after. Vital signs stable encourage to drink water throughout day.Reasons explained and verbalized understanding. Ambulated steady gait.  

## 2022-10-24 ENCOUNTER — Observation Stay
Admission: RE | Admit: 2022-10-24 | Discharge: 2022-10-25 | Disposition: A | Discharge status: Home / Self Care | Payer: 59 | Attending: Cardiology | Admitting: Cardiology

## 2022-10-24 ENCOUNTER — Other Ambulatory Visit: Payer: Self-pay

## 2022-10-24 ENCOUNTER — Encounter
Admission: RE | Disposition: A | Discharge status: Home / Self Care | Payer: Self-pay | Source: Home / Self Care | Attending: Cardiology

## 2022-10-24 ENCOUNTER — Encounter: Payer: Self-pay | Admitting: Cardiology

## 2022-10-24 DIAGNOSIS — I2510 Atherosclerotic heart disease of native coronary artery without angina pectoris: Principal | ICD-10-CM | POA: Insufficient documentation

## 2022-10-24 DIAGNOSIS — Z7722 Contact with and (suspected) exposure to environmental tobacco smoke (acute) (chronic): Secondary | ICD-10-CM | POA: Insufficient documentation

## 2022-10-24 DIAGNOSIS — Z7982 Long term (current) use of aspirin: Secondary | ICD-10-CM | POA: Insufficient documentation

## 2022-10-24 DIAGNOSIS — Z79899 Other long term (current) drug therapy: Secondary | ICD-10-CM | POA: Insufficient documentation

## 2022-10-24 DIAGNOSIS — R9430 Abnormal result of cardiovascular function study, unspecified: Secondary | ICD-10-CM

## 2022-10-24 HISTORY — PX: LEFT HEART CATH AND CORONARY ANGIOGRAPHY: CATH118249

## 2022-10-24 HISTORY — PX: CORONARY STENT INTERVENTION: CATH118234

## 2022-10-24 LAB — POCT ACTIVATED CLOTTING TIME
Activated Clotting Time: 305 seconds
Activated Clotting Time: 342 seconds

## 2022-10-24 SURGERY — LEFT HEART CATH AND CORONARY ANGIOGRAPHY
Anesthesia: Moderate Sedation

## 2022-10-24 MED ORDER — PRASUGREL HCL 10 MG PO TABS
ORAL_TABLET | ORAL | Status: DC | PRN
  Administered 2022-10-24: 60 mg via ORAL

## 2022-10-24 MED ORDER — ONDANSETRON HCL 4 MG/2ML IJ SOLN: 4.0000 mg | Freq: Four times a day (QID) | INTRAMUSCULAR | Status: DC | PRN

## 2022-10-24 MED ORDER — ATORVASTATIN CALCIUM 80 MG PO TABS
80.0000 mg | ORAL_TABLET | Freq: Every day | ORAL | Status: DC
  Filled 2022-10-24: qty 1

## 2022-10-24 MED ORDER — HEPARIN (PORCINE) IN NACL 1000-0.9 UT/500ML-% IV SOLN
INTRAVENOUS | Status: AC
  Filled 2022-10-24: qty 1000

## 2022-10-24 MED ORDER — ASPIRIN 81 MG PO CHEW
CHEWABLE_TABLET | ORAL | Status: DC | PRN
  Administered 2022-10-24: 324 mg via ORAL

## 2022-10-24 MED ORDER — ASPIRIN 81 MG PO CHEW
CHEWABLE_TABLET | ORAL | Status: AC
  Filled 2022-10-24: qty 4

## 2022-10-24 MED ORDER — SODIUM CHLORIDE 0.9% FLUSH
3.0000 mL | Freq: Two times a day (BID) | INTRAVENOUS | Status: DC
  Administered 2022-10-25: 3 mL via INTRAVENOUS

## 2022-10-24 MED ORDER — IOHEXOL 300 MG/ML  SOLN
INTRAMUSCULAR | Status: DC | PRN
  Administered 2022-10-24: 298 mL

## 2022-10-24 MED ORDER — METOPROLOL SUCCINATE ER 50 MG PO TB24
50.0000 mg | ORAL_TABLET | Freq: Every day | ORAL | Status: DC
  Administered 2022-10-24: 50 mg via ORAL

## 2022-10-24 MED ORDER — HYDRALAZINE HCL 20 MG/ML IJ SOLN
10.0000 mg | INTRAMUSCULAR | Status: AC | PRN
  Administered 2022-10-24: 10 mg via INTRAVENOUS

## 2022-10-24 MED ORDER — METOPROLOL SUCCINATE ER 50 MG PO TB24
ORAL_TABLET | ORAL | Status: AC
  Filled 2022-10-24: qty 1

## 2022-10-24 MED ORDER — HEPARIN (PORCINE) IN NACL 1000-0.9 UT/500ML-% IV SOLN
INTRAVENOUS | Status: DC | PRN
  Administered 2022-10-24: 1000 mL

## 2022-10-24 MED ORDER — SODIUM CHLORIDE 0.9 % IV SOLN: 250.0000 mL | INTRAVENOUS | Status: DC | PRN

## 2022-10-24 MED ORDER — NITROGLYCERIN 1 MG/10 ML FOR IR/CATH LAB
INTRA_ARTERIAL | Status: DC | PRN
  Administered 2022-10-24 (×3): 200 ug via INTRACORONARY

## 2022-10-24 MED ORDER — MIDAZOLAM HCL 2 MG/2ML IJ SOLN
INTRAMUSCULAR | Status: DC | PRN
  Administered 2022-10-24: 1 mg via INTRAVENOUS

## 2022-10-24 MED ORDER — SODIUM CHLORIDE 0.9% FLUSH: 3.0000 mL | INTRAVENOUS | Status: DC | PRN

## 2022-10-24 MED ORDER — NITROGLYCERIN 1 MG/10 ML FOR IR/CATH LAB
INTRA_ARTERIAL | Status: AC
  Filled 2022-10-24: qty 10

## 2022-10-24 MED ORDER — SODIUM CHLORIDE 0.9% FLUSH
3.0000 mL | Freq: Two times a day (BID) | INTRAVENOUS | Status: DC
  Administered 2022-10-24: 3 mL via INTRAVENOUS

## 2022-10-24 MED ORDER — ACETAMINOPHEN 325 MG PO TABS
650.0000 mg | ORAL_TABLET | ORAL | Status: DC | PRN
  Administered 2022-10-24 (×2): 650 mg via ORAL
  Filled 2022-10-24: qty 2

## 2022-10-24 MED ORDER — VERAPAMIL HCL 2.5 MG/ML IV SOLN
INTRAVENOUS | Status: AC
  Filled 2022-10-24: qty 2

## 2022-10-24 MED ORDER — SODIUM CHLORIDE 0.9 % WEIGHT BASED INFUSION: 1.0000 mL/kg/h | INTRAVENOUS | Status: AC

## 2022-10-24 MED ORDER — LABETALOL HCL 5 MG/ML IV SOLN
INTRAVENOUS | Status: AC
  Administered 2022-10-24: 10 mg via INTRAVENOUS
  Filled 2022-10-24: qty 4

## 2022-10-24 MED ORDER — PRASUGREL HCL 10 MG PO TABS
ORAL_TABLET | ORAL | Status: AC
  Filled 2022-10-24: qty 6

## 2022-10-24 MED ORDER — VERAPAMIL HCL 2.5 MG/ML IV SOLN
INTRAVENOUS | Status: DC | PRN
  Administered 2022-10-24: 2.5 mg via INTRA_ARTERIAL

## 2022-10-24 MED ORDER — PRASUGREL HCL 10 MG PO TABS
10.0000 mg | ORAL_TABLET | Freq: Every day | ORAL | Status: DC
  Filled 2022-10-24: qty 1

## 2022-10-24 MED ORDER — MIDAZOLAM HCL 2 MG/2ML IJ SOLN
INTRAMUSCULAR | Status: AC
  Filled 2022-10-24: qty 2

## 2022-10-24 MED ORDER — LABETALOL HCL 5 MG/ML IV SOLN: 10.0000 mg | INTRAVENOUS | Status: AC | PRN

## 2022-10-24 MED ORDER — HEPARIN SODIUM (PORCINE) 1000 UNIT/ML IJ SOLN
INTRAMUSCULAR | Status: DC | PRN
  Administered 2022-10-24: 7000 [IU] via INTRAVENOUS
  Administered 2022-10-24: 4500 [IU] via INTRAVENOUS

## 2022-10-24 MED ORDER — SODIUM CHLORIDE 0.9 % WEIGHT BASED INFUSION
1.0000 mL/kg/h | INTRAVENOUS | Status: DC
  Administered 2022-10-24: 1 mL/kg/h via INTRAVENOUS

## 2022-10-24 MED ORDER — HEPARIN SODIUM (PORCINE) 1000 UNIT/ML IJ SOLN
INTRAMUSCULAR | Status: AC
  Filled 2022-10-24: qty 10

## 2022-10-24 MED ORDER — HYDRALAZINE HCL 20 MG/ML IJ SOLN
INTRAMUSCULAR | Status: AC
  Filled 2022-10-24: qty 1

## 2022-10-24 MED ORDER — FENTANYL CITRATE (PF) 100 MCG/2ML IJ SOLN
INTRAMUSCULAR | Status: DC | PRN
  Administered 2022-10-24 (×2): 25 ug via INTRAVENOUS

## 2022-10-24 MED ORDER — SODIUM CHLORIDE 0.9 % WEIGHT BASED INFUSION
3.0000 mL/kg/h | INTRAVENOUS | Status: AC
  Administered 2022-10-24: 3 mL/kg/h via INTRAVENOUS

## 2022-10-24 MED ORDER — FENTANYL CITRATE (PF) 100 MCG/2ML IJ SOLN
INTRAMUSCULAR | Status: AC
  Filled 2022-10-24: qty 2

## 2022-10-24 MED ORDER — ASPIRIN 81 MG PO CHEW: 81.0000 mg | CHEWABLE_TABLET | Freq: Every day | ORAL | Status: DC

## 2022-10-24 MED ORDER — ACETAMINOPHEN 325 MG PO TABS
ORAL_TABLET | ORAL | Status: AC
  Filled 2022-10-24: qty 2

## 2022-10-24 MED ORDER — ASPIRIN 81 MG PO CHEW: 81.0000 mg | CHEWABLE_TABLET | ORAL | Status: DC

## 2022-10-24 SURGICAL SUPPLY — 20 items
BALLN EUPHORA RX 2.5X15 (BALLOONS) ×2
BALLN ~~LOC~~ TREK NEO RX 3.0X20 (BALLOONS) ×2
BALLOON EUPHORA RX 2.5X15 (BALLOONS) IMPLANT
BALLOON ~~LOC~~ TREK NEO RX 3.0X20 (BALLOONS) IMPLANT
CATH 5FR JL3.5 JR4 ANG PIG MP (CATHETERS) IMPLANT
CATH LAUNCHER 6FR EBU 3 (CATHETERS) IMPLANT
CATH VISTA GUIDE 6FR JL4 (CATHETERS) IMPLANT
DEVICE RAD TR BAND REGULAR (VASCULAR PRODUCTS) IMPLANT
DRAPE BRACHIAL (DRAPES) IMPLANT
GLIDESHEATH SLEND SS 6F .021 (SHEATH) IMPLANT
GUIDEWIRE INQWIRE 1.5J.035X260 (WIRE) IMPLANT
INQWIRE 1.5J .035X260CM (WIRE) ×2
KIT ENCORE 26 ADVANTAGE (KITS) IMPLANT
PACK CARDIAC CATH (CUSTOM PROCEDURE TRAY) ×3 IMPLANT
PROTECTION STATION PRESSURIZED (MISCELLANEOUS) ×2
SET ATX-X65L (MISCELLANEOUS) IMPLANT
STATION PROTECTION PRESSURIZED (MISCELLANEOUS) IMPLANT
STENT ONYX FRONTIER 2.75X22 (Permanent Stent) IMPLANT
TUBING CIL FLEX 10 FLL-RA (TUBING) IMPLANT
WIRE ASAHI PROWATER 180CM (WIRE) IMPLANT

## 2022-10-24 NOTE — Progress Notes (Signed)
Dr. Darrold Junker in at bedside, speaking with pt. And his daughter re: cath/PCI results. Family and pt. Verbalized understanding of conversation.

## 2022-10-24 NOTE — Plan of Care (Signed)

## 2022-10-25 ENCOUNTER — Encounter: Payer: Self-pay | Admitting: Cardiology

## 2022-10-25 DIAGNOSIS — I2510 Atherosclerotic heart disease of native coronary artery without angina pectoris: Secondary | ICD-10-CM | POA: Diagnosis not present

## 2022-10-25 LAB — BASIC METABOLIC PANEL
Anion gap: 9 (ref 5–15)
BUN: 13 mg/dL (ref 8–23)
CO2: 24 mmol/L (ref 22–32)
Calcium: 8.8 mg/dL — ABNORMAL LOW (ref 8.9–10.3)
Chloride: 104 mmol/L (ref 98–111)
Creatinine, Ser: 0.79 mg/dL (ref 0.61–1.24)
GFR, Estimated: >60 mL/min (ref >60)
Glucose, Bld: 107 mg/dL — ABNORMAL HIGH (ref 70–99)
Potassium: 3.6 mmol/L (ref 3.5–5.1)
Sodium: 137 mmol/L (ref 135–145)

## 2022-10-25 LAB — CBC
HCT: 42.7 % (ref 39.0–52.0)
Hemoglobin: 14.3 g/dL (ref 13.0–17.0)
MCH: 30.9 pg (ref 26.0–34.0)
MCHC: 33.5 g/dL (ref 30.0–36.0)
MCV: 92.2 fL (ref 80.0–100.0)
Platelets: 234 10*3/uL (ref 150–400)
RBC: 4.63 MIL/uL (ref 4.22–5.81)
RDW: 12.4 % (ref 11.5–15.5)
WBC: 9.2 10*3/uL (ref 4.0–10.5)
nRBC: 0 % (ref 0.0–0.2)

## 2022-10-25 MED ORDER — METOPROLOL SUCCINATE ER 25 MG PO TB24: 25.0000 mg | ORAL_TABLET | Freq: Every day | ORAL | Status: DC

## 2022-10-25 MED ORDER — METOPROLOL SUCCINATE ER 25 MG PO TB24: 25.0000 mg | ORAL_TABLET | Freq: Every day | ORAL | 6 refills | Status: AC

## 2022-10-25 MED ORDER — PRASUGREL HCL 10 MG PO TABS: 10.0000 mg | ORAL_TABLET | Freq: Every day | ORAL | 6 refills | Status: AC

## 2022-10-25 NOTE — Discharge Summary (Signed)
Physician Discharge Summary  Patient ID: Philip Manning MRN: [id number] DOB/AGE: 19-Apr-1961 62 y.o.  Admit date: 10/24/2022 Discharge date: 10/25/2022  Primary Discharge Diagnosis coronary artery disease Secondary Discharge Diagnosis chest pain  Significant Diagnostic Studies: yes  Consults: None  Hospital Course: Patient underwent elective cardiac catheterization 6-13/2024 which revealed high-grade 80% stenosis proximal/mid LAD.  She underwent PCI receiving 2.75 x 22 mm Onyx frontier drug-eluting stent with excellent angiographic result.  The patient was admitted to telemetry overnight with uncomplicated hospitalization.  On the morning of 10/25/2022, the patient was ambulating without difficulty and was discharged home in stable condition.   Discharge Exam: Blood pressure (!) 120/90, pulse 69, temperature 97.7 F (36.5 C), resp. rate 20, height 5\' 7"  (1.702 m), weight 89.8 kg, SpO2 98 %.   General appearance: alert Head: Normocephalic, without obvious abnormality, atraumatic Eyes: conjunctivae/corneas clear. PERRL, EOM's intact. Fundi benign. Ears: normal TM's and external ear canals both ears Nose: Nares normal. Septum midline. Mucosa normal. No drainage or sinus tenderness. Throat: lips, mucosa, and tongue normal; teeth and gums normal Neck: no adenopathy, no carotid bruit, no JVD, supple, symmetrical, trachea midline, and thyroid not enlarged, symmetric, no tenderness/mass/nodules Back: symmetric, no curvature. ROM normal. No CVA tenderness. Resp: clear to auscultation bilaterally Cardio: regular rate and rhythm, S1, S2 normal, no murmur, click, rub or gallop GI: soft, non-tender; bowel sounds normal; no masses,  no organomegaly Extremities: extremities normal, atraumatic, no cyanosis or edema Pulses: 2+ and symmetric Lymph nodes: Cervical, supraclavicular, and axillary nodes normal. Incision/Wound: Right radial site well-healing Labs:   Lab Results  Component Value Date    WBC 9.2 10/25/2022   HGB 14.3 10/25/2022   HCT 42.7 10/25/2022   MCV 92.2 10/25/2022   PLT 234 10/25/2022    Recent Labs  Lab 10/25/22 0646  NA 137  K 3.6  CL 104  CO2 24  BUN 13  CREATININE 0.79  CALCIUM 8.8*  GLUCOSE 107*      Radiology:  EKG: Sinus rhythm 61 bpm  FOLLOW UP PLANS AND APPOINTMENTS Discharge Instructions     AMB Referral to Cardiac Rehabilitation - Phase II   Complete by: As directed    Diagnosis: Coronary Stents   After initial evaluation and assessments completed: Virtual Based Care may be provided alone or in conjunction with Phase 2 Cardiac Rehab based on patient barriers.: Yes   Intensive Cardiac Rehabilitation (ICR) [facility] location only OR Traditional Cardiac Rehabilitation (TCR) *If criteria for ICR are not met will enroll in TCR [facility] only): Yes      Allergies as of 10/25/2022   No Known Allergies      Medication List     STOP taking these medications    FISH OIL + D3 PO   metoprolol tartrate 100 MG tablet Commonly known as: LOPRESSOR   ondansetron 4 MG disintegrating tablet Commonly known as: Zofran ODT   oxyCODONE-acetaminophen 5-325 MG tablet Commonly known as: PERCOCET/ROXICET   senna-docusate 8.6-50 MG tablet Commonly known as: Senokot-S   tamsulosin 0.4 MG Caps capsule Commonly known as: FLOMAX   ZINC OXIDE PO       TAKE these medications    ascorbic acid 500 MG tablet Commonly known as: VITAMIN C Take 500 mg by mouth daily.   aspirin EC 81 MG tablet Take 81 mg by mouth daily.   atorvastatin 80 MG tablet Commonly known as: LIPITOR Take 80 mg by mouth daily.   metoprolol succinate 25 MG 24 hr tablet Commonly known  as: TOPROL-XL Take 1 tablet (25 mg total) by mouth daily.   prasugrel 10 MG Tabs tablet Commonly known as: EFFIENT Take 1 tablet (10 mg total) by mouth daily.         BRING ALL MEDICATIONS WITH YOU TO FOLLOW UP APPOINTMENTS  Time spent with patient to include physician time: 25  minutes Signed:  Marcina Millard MD, PhD, [facility] 10/25/2022, 7:53 AM

## 2022-10-25 NOTE — Discharge Instructions (Signed)
Patient may shower 10/26/2022.  Avoid lifting greater than 20 pounds with right hand.

## 2022-10-28 LAB — LIPOPROTEIN A (LPA): Lipoprotein (a): 143.5 nmol/L — ABNORMAL HIGH (ref <75.0)

## 2022-10-31 ENCOUNTER — Inpatient Hospital Stay: Admission: RE | Admit: 2022-10-31 | Payer: 59 | Source: Ambulatory Visit

## 2022-11-14 ENCOUNTER — Emergency Department: Payer: 59

## 2022-11-14 ENCOUNTER — Emergency Department
Admission: EM | Admit: 2022-11-14 | Discharge: 2022-11-15 | Disposition: A | Discharge status: Home / Self Care | Payer: 59 | Attending: Emergency Medicine | Admitting: Emergency Medicine

## 2022-11-14 DIAGNOSIS — M25511 Pain in right shoulder: Secondary | ICD-10-CM | POA: Insufficient documentation

## 2022-11-14 DIAGNOSIS — R310 Gross hematuria: Secondary | ICD-10-CM | POA: Insufficient documentation

## 2022-11-14 DIAGNOSIS — M25512 Pain in left shoulder: Secondary | ICD-10-CM | POA: Diagnosis not present

## 2022-11-14 DIAGNOSIS — Y92410 Unspecified street and highway as the place of occurrence of the external cause: Secondary | ICD-10-CM | POA: Insufficient documentation

## 2022-11-14 DIAGNOSIS — M542 Cervicalgia: Secondary | ICD-10-CM | POA: Diagnosis present

## 2022-11-14 MED ORDER — ACETAMINOPHEN 500 MG PO TABS
1000.0000 mg | ORAL_TABLET | Freq: Once | ORAL | Status: DC
  Filled 2022-11-14: qty 2

## 2022-11-14 MED ORDER — LIDOCAINE 5 % EX PTCH
1.0000 | MEDICATED_PATCH | CUTANEOUS | Status: DC
  Filled 2022-11-14: qty 1

## 2022-11-14 MED ORDER — METHOCARBAMOL 500 MG PO TABS: 500.0000 mg | ORAL_TABLET | Freq: Three times a day (TID) | ORAL | 0 refills | Status: AC | PRN

## 2022-11-14 MED ORDER — METHOCARBAMOL 500 MG PO TABS
500.0000 mg | ORAL_TABLET | Freq: Once | ORAL | Status: AC
  Administered 2022-11-15: 500 mg via ORAL
  Filled 2022-11-14: qty 1
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
## 2022-11-15 ENCOUNTER — Emergency Department: Payer: 59

## 2022-11-15 LAB — CBC WITH DIFFERENTIAL/PLATELET
Abs Immature Granulocytes: 0.07 10*3/uL (ref 0.00–0.07)
Basophils Absolute: 0.1 10*3/uL (ref 0.0–0.1)
Basophils Relative: 0 %
Eosinophils Absolute: 0.6 10*3/uL — ABNORMAL HIGH (ref 0.0–0.5)
Eosinophils Relative: 4 %
HCT: 43.8 % (ref 39.0–52.0)
Hemoglobin: 14.3 g/dL (ref 13.0–17.0)
Immature Granulocytes: 0 %
Lymphocytes Relative: 16 %
Lymphs Abs: 2.5 10*3/uL (ref 0.7–4.0)
MCH: 31 pg (ref 26.0–34.0)
MCHC: 32.6 g/dL (ref 30.0–36.0)
MCV: 95 fL (ref 80.0–100.0)
Monocytes Absolute: 1.1 10*3/uL — ABNORMAL HIGH (ref 0.1–1.0)
Monocytes Relative: 7 %
Neutro Abs: 11.4 10*3/uL — ABNORMAL HIGH (ref 1.7–7.7)
Neutrophils Relative %: 73 %
Platelets: 275 10*3/uL (ref 150–400)
RBC: 4.61 MIL/uL (ref 4.22–5.81)
RDW: 12.2 % (ref 11.5–15.5)
WBC: 15.7 10*3/uL — ABNORMAL HIGH (ref 4.0–10.5)
nRBC: 0 % (ref 0.0–0.2)

## 2022-11-15 LAB — URINALYSIS, ROUTINE W REFLEX MICROSCOPIC
Bacteria, UA: NONE SEEN
Bilirubin Urine: NEGATIVE
Glucose, UA: NEGATIVE mg/dL
Ketones, ur: NEGATIVE mg/dL
Leukocytes,Ua: NEGATIVE
Nitrite: NEGATIVE
Protein, ur: 30 mg/dL — AB
RBC / HPF: >50 RBC/hpf (ref 0–5)
Specific Gravity, Urine: 1.019 (ref 1.005–1.030)
Squamous Epithelial / HPF: NONE SEEN /HPF (ref 0–5)
pH: 5 (ref 5.0–8.0)

## 2022-11-15 LAB — COMPREHENSIVE METABOLIC PANEL
ALT: 42 U/L (ref 0–44)
AST: 28 U/L (ref 15–41)
Albumin: 4.4 g/dL (ref 3.5–5.0)
Alkaline Phosphatase: 60 U/L (ref 38–126)
Anion gap: 7 (ref 5–15)
BUN: 28 mg/dL — ABNORMAL HIGH (ref 8–23)
CO2: 25 mmol/L (ref 22–32)
Calcium: 9.2 mg/dL (ref 8.9–10.3)
Chloride: 105 mmol/L (ref 98–111)
Creatinine, Ser: 1.23 mg/dL (ref 0.61–1.24)
GFR, Estimated: >60 mL/min (ref >60)
Glucose, Bld: 103 mg/dL — ABNORMAL HIGH (ref 70–99)
Potassium: 4.1 mmol/L (ref 3.5–5.1)
Sodium: 137 mmol/L (ref 135–145)
Total Bilirubin: 0.8 mg/dL (ref 0.3–1.2)
Total Protein: 7 g/dL (ref 6.5–8.1)

## 2022-11-15 MED ORDER — IOHEXOL 300 MG/ML  SOLN
100.0000 mL | Freq: Once | INTRAMUSCULAR | Status: AC | PRN
  Administered 2022-11-15: 100 mL via INTRAVENOUS

## 2022-11-15 MED ORDER — LACTATED RINGERS IV BOLUS
1000.0000 mL | Freq: Once | INTRAVENOUS | Status: AC
  Administered 2022-11-15: 1000 mL via INTRAVENOUS

## 2022-11-15 NOTE — ED Notes (Signed)
Pt returned from bathroom after being discharged and c/o bright red blood in his urine/ MD made aware, pt taken back to room and discharge reversed - see orders
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
## 2023-01-17 ENCOUNTER — Other Ambulatory Visit: Payer: Self-pay | Admitting: Neurosurgery

## 2023-01-17 ENCOUNTER — Other Ambulatory Visit: Payer: Self-pay | Admitting: Physical Medicine and Rehabilitation

## 2023-01-17 DIAGNOSIS — R29818 Other symptoms and signs involving the nervous system: Secondary | ICD-10-CM

## 2023-01-23 ENCOUNTER — Ambulatory Visit
Admission: RE | Admit: 2023-01-23 | Discharge: 2023-01-23 | Disposition: A | Discharge status: Home / Self Care | Payer: 59 | Source: Ambulatory Visit | Attending: Physical Medicine and Rehabilitation | Admitting: Physical Medicine and Rehabilitation

## 2023-01-23 DIAGNOSIS — R29818 Other symptoms and signs involving the nervous system: Secondary | ICD-10-CM | POA: Diagnosis present

## 2023-10-09 ENCOUNTER — Other Ambulatory Visit: Payer: Self-pay | Admitting: Unknown Physician Specialty

## 2023-10-09 DIAGNOSIS — R430 Anosmia: Secondary | ICD-10-CM

## 2023-10-15 ENCOUNTER — Encounter: Payer: Self-pay | Admitting: Unknown Physician Specialty

## 2023-10-30 ENCOUNTER — Other Ambulatory Visit

## 2023-12-02 ENCOUNTER — Other Ambulatory Visit
